# Patient Record
Sex: Female | Born: 1970 | Race: White | Hispanic: No | Smoking: Never smoker
Health system: Southern US, Community
[De-identification: ages and names within clinical notes are randomized; demographics above are authoritative.]

## PROBLEM LIST (undated history)

## (undated) DIAGNOSIS — Z8619 Personal history of other infectious and parasitic diseases: Secondary | ICD-10-CM

## (undated) DIAGNOSIS — F32A Depression, unspecified: Secondary | ICD-10-CM

## (undated) DIAGNOSIS — F329 Major depressive disorder, single episode, unspecified: Secondary | ICD-10-CM

## (undated) HISTORY — PX: BREAST ENHANCEMENT SURGERY: SHX7

## (undated) HISTORY — PX: COLPOSCOPY: SHX161

## (undated) HISTORY — DX: Personal history of other infectious and parasitic diseases: Z86.19

## (undated) HISTORY — PX: LABIOPLASTY: SHX1900

## (undated) HISTORY — DX: Depression, unspecified: F32.A

## (undated) HISTORY — PX: AUGMENTATION MAMMAPLASTY: SUR837

## (undated) HISTORY — DX: Major depressive disorder, single episode, unspecified: F32.9

---

## 2015-06-30 ENCOUNTER — Telehealth: Payer: Self-pay | Admitting: Family Medicine

## 2015-06-30 NOTE — Telephone Encounter (Signed)
errenous °

## 2015-09-06 ENCOUNTER — Encounter: Payer: Self-pay | Admitting: Family Medicine

## 2015-09-15 ENCOUNTER — Encounter: Payer: Self-pay | Admitting: Family Medicine

## 2015-09-20 ENCOUNTER — Other Ambulatory Visit: Payer: Self-pay | Admitting: Family Medicine

## 2015-09-20 MED ORDER — NORGESTIMATE-ETH ESTRADIOL 0.25-35 MG-MCG PO TABS
1.0000 | ORAL_TABLET | Freq: Every day | ORAL | Status: DC
Start: 2015-09-20 — End: 2015-09-20

## 2015-09-20 MED ORDER — NORGESTIMATE-ETH ESTRADIOL 0.25-35 MG-MCG PO TABS: 1.0000 | ORAL_TABLET | Freq: Every day | ORAL | Status: DC

## 2015-10-15 ENCOUNTER — Encounter: Payer: Self-pay | Admitting: Family Medicine

## 2015-11-13 ENCOUNTER — Emergency Department: Payer: 59

## 2015-11-13 ENCOUNTER — Encounter: Payer: Self-pay | Admitting: Emergency Medicine

## 2015-11-13 ENCOUNTER — Emergency Department
Admission: EM | Admit: 2015-11-13 | Discharge: 2015-11-13 | Disposition: A | Discharge status: Home / Self Care | Payer: 59 | Attending: Emergency Medicine | Admitting: Emergency Medicine

## 2015-11-13 DIAGNOSIS — J159 Unspecified bacterial pneumonia: Secondary | ICD-10-CM | POA: Diagnosis not present

## 2015-11-13 DIAGNOSIS — R000 Tachycardia, unspecified: Secondary | ICD-10-CM | POA: Diagnosis not present

## 2015-11-13 DIAGNOSIS — Z792 Long term (current) use of antibiotics: Secondary | ICD-10-CM | POA: Insufficient documentation

## 2015-11-13 DIAGNOSIS — Z79899 Other long term (current) drug therapy: Secondary | ICD-10-CM | POA: Diagnosis not present

## 2015-11-13 DIAGNOSIS — Z880 Allergy status to penicillin: Secondary | ICD-10-CM | POA: Diagnosis not present

## 2015-11-13 DIAGNOSIS — J189 Pneumonia, unspecified organism: Secondary | ICD-10-CM

## 2015-11-13 DIAGNOSIS — R05 Cough: Secondary | ICD-10-CM | POA: Diagnosis present

## 2015-11-13 LAB — BASIC METABOLIC PANEL
ANION GAP: 9 (ref 5–15)
BUN: 10 mg/dL (ref 6–20)
CHLORIDE: 106 mmol/L (ref 101–111)
CO2: 20 mmol/L — AB (ref 22–32)
CREATININE: 0.88 mg/dL (ref 0.44–1.00)
Calcium: 9 mg/dL (ref 8.9–10.3)
GFR calc non Af Amer: >60 mL/min (ref >60)
Glucose, Bld: 105 mg/dL — ABNORMAL HIGH (ref 65–99)
Potassium: 3.2 mmol/L — ABNORMAL LOW (ref 3.5–5.1)
Sodium: 135 mmol/L (ref 135–145)

## 2015-11-13 LAB — CBC WITH DIFFERENTIAL/PLATELET
BASOS ABS: 0 10*3/uL (ref 0–0.1)
Basophils Relative: 1 %
Eosinophils Absolute: 0 10*3/uL (ref 0–0.7)
Eosinophils Relative: 0 %
HEMATOCRIT: 37.2 % (ref 35.0–47.0)
Hemoglobin: 13.1 g/dL (ref 12.0–16.0)
LYMPHS ABS: 1.6 10*3/uL (ref 1.0–3.6)
LYMPHS PCT: 19 %
MCH: 31.3 pg (ref 26.0–34.0)
MCHC: 35.1 g/dL (ref 32.0–36.0)
MCV: 89.2 fL (ref 80.0–100.0)
Monocytes Absolute: 0.4 10*3/uL (ref 0.2–0.9)
Monocytes Relative: 5 %
NEUTROS ABS: 6.1 10*3/uL (ref 1.4–6.5)
Neutrophils Relative %: 75 %
Platelets: 195 10*3/uL (ref 150–440)
RBC: 4.17 MIL/uL (ref 3.80–5.20)
RDW: 13.5 % (ref 11.5–14.5)
WBC: 8.2 10*3/uL (ref 3.6–11.0)

## 2015-11-13 LAB — TROPONIN I: Troponin I: <0.03 ng/mL (ref <0.031)

## 2015-11-13 MED ORDER — ACETAMINOPHEN 325 MG PO TABS
650.0000 mg | ORAL_TABLET | Freq: Once | ORAL | Status: AC | PRN
  Administered 2015-11-13: 650 mg via ORAL
  Filled 2015-11-13: qty 2

## 2015-11-13 MED ORDER — AZITHROMYCIN 500 MG PO TABS: ORAL_TABLET | ORAL | Status: DC

## 2015-11-13 MED ORDER — AZITHROMYCIN 250 MG PO TABS
500.0000 mg | ORAL_TABLET | Freq: Once | ORAL | Status: AC
  Administered 2015-11-13: 500 mg via ORAL
  Filled 2015-11-13: qty 2

## 2015-11-13 NOTE — ED Provider Notes (Signed)
[facility] Emergency Department Provider Note  ____________________________________________  Time seen: 3:20 AM  I have reviewed the triage vital signs and the nursing notes.  History by:  Patient  HISTORY  Chief Complaint Cough     HPI Melinda Roach is a 44 y.o. female who began to have a cough on Thursday afternoon. This has worsened over the past 36 hours. Tonight she went to work, but did not feel well. Her coworkers commented on this. At that time, it was found she had a fever as well. She was tachycardic at 125 and tachypnea. These are the patient's descriptives - she is a Engineer, civil (consulting)nurse at [employer].  She reports some chest discomfort which she thinks is due to her persistent cough.  She confirms some shortness of breath as well as.   History reviewed. No pertinent past medical history.  There are no active problems to display for this patient.   Past Surgical History  Procedure Laterality Date  . Cesarean section      Current Outpatient Rx  Name  Route  Sig  Dispense  Refill  . azithromycin (ZITHROMAX) 500 MG tablet      Take 1 tab once a day for 2 days (The patient received the first of 3 days of treatment in the emergency department)   2 tablet   0   . norgestimate-ethinyl estradiol (ORTHO-CYCLEN,SPRINTEC,PREVIFEM) 0.25-35 MG-MCG tablet   Oral   Take 1 tablet by mouth daily.   1 Package   11     Allergies Penicillins  No family history on file.  Social History Social History  Substance Use Topics  . Smoking status: Never Smoker   . Smokeless tobacco: None  . Alcohol Use: No    Review of Systems  Constitutional: Positive for fever. ENT: Negative for congestion. Cardiovascular: Positive for chest pain. Respiratory: Positive for shortness of breath and for cough. Gastrointestinal: Negative for abdominal pain, vomiting and diarrhea. Genitourinary: Negative for dysuria. Musculoskeletal: No myalgias or injuries. Skin:  Negative for rash. Neurological: Negative for headache or focal weakness   10-point ROS otherwise negative.  ____________________________________________   PHYSICAL EXAM:  VITAL SIGNS: ED Triage Vitals  Enc Vitals Group     BP 11/13/15 0020 111/71 mmHg     Pulse Rate 11/13/15 0020 117     Resp 11/13/15 0020 22     Temp 11/13/15 0020 99.5 F (37.5 C)     Temp Source 11/13/15 0020 Oral     SpO2 11/13/15 0020 100 %     Weight 11/13/15 0020 190 lb (86.183 kg)     Height 11/13/15 0020 5\' 5"  (1.651 m)     Head Cir --      Peak Flow --      Pain Score 11/13/15 0036 4     Pain Loc --      Pain Edu? --      Excl. in GC? --     Constitutional:  Alert and oriented. Appears slightly uncomfortable but overall no acute distress.Marland Kitchen. ENT   Head: Normocephalic and atraumatic.   Nose: No congestion/rhinnorhea.       Mouth: No erythema, no swelling   Cardiovascular: Normal rate, regular rhythm, no murmur noted Respiratory:  Normal respiratory effort, no tachypnea.    Breath sounds are overall clear, with mild diminishment on the left..  Gastrointestinal: Soft and nontender. No distention.  Back: No muscle spasm, no tenderness, no CVA tenderness. Musculoskeletal: No deformity noted. Nontender with normal range of  motion in all extremities.  No noted edema. Neurologic:  Communicative. Normal appearing spontaneous movement in all 4 extremities. No gross focal neurologic deficits are appreciated.  Skin:  Skin is warm, dry. No rash noted. Psychiatric: Mood and affect are normal. Speech and behavior are normal.  ____________________________________________    LABS (pertinent positives/negatives)  Labs Reviewed  BASIC METABOLIC PANEL - Abnormal; Notable for the following:    Potassium 3.2 (*)    CO2 20 (*)    Glucose, Bld 105 (*)    All other components within normal limits  CBC WITH DIFFERENTIAL/PLATELET  TROPONIN I      ____________________________________________   EKG  ED ECG REPORT I, Mikhaila Roh W, the attending physician, personally viewed and interpreted this ECG.   Date: 11/13/2015  EKG Time: 4:43 AM  Rate: 89  Rhythm:  Normal sinus rhythm  Axis: Normal  Intervals: Normal  ST&T Change: None noted   ____________________________________________    RADIOLOGY  Chest x-ray: IMPRESSION: Focal opacity in the left lung base behind the heart probably representing focal pneumonia. Followup PA and lateral chest X-ray is recommended in 3-4 weeks following trial of antibiotic therapy to ensure resolution and exclude underlying malignancy.  ____________________________________________   INITIAL IMPRESSION / ASSESSMENT AND PLAN / ED COURSE  Pertinent labs & imaging results that were available during my care of the patient were reviewed by me and considered in my medical decision making (see chart for details).  Alert, communicative, 44 year old nurse with a bad cough. Chest x-ray shows a small focal area that is worrisome for pneumonia. We will start the patient on antibiotics.  I discussed further testing with the patient. She prefers to have blood work drawn at this time. We will treat her with azithromycin. At this time, her heart rate is 86, her oxygen saturation level was 97%.. She appears nontoxic and overall well.  ----------------------------------------- 5:12 AM on 11/13/2015 -----------------------------------------  Blood tests are overall unremarkable. She is white blood cell count of 8.2. Troponin is undetectable at less than 0.03. An EKG that was obtained shows no ischemic changes.  We will discharge the patient home with a projection for tumor days of azithromycin, 500 mg per day. I've explained to the patient had this will give her 10 days of antibiotic coverage. I've asked her to follow with her primary physician and to return to the emergency department if she feels  worse or has other urgent concerns.  ____________________________________________   FINAL CLINICAL IMPRESSION(S) / ED DIAGNOSES  Final diagnoses:  Community acquired pneumonia      Darien Ramus, MD 11/13/15 [phone]

## 2015-11-13 NOTE — ED Notes (Signed)
Pt reports dry cough since yesterday.  Pt reports some chest heaviness.  Pt reports her BP was up and pulse fast.  Pt NAD at this time.

## 2015-11-13 NOTE — ED Notes (Signed)
Pt. States she woke this morning with sore throat and cold like symptoms.  Pt. Denies anyone in house with like symptoms.

## 2015-11-13 NOTE — Discharge Instructions (Signed)

## 2015-11-15 ENCOUNTER — Encounter: Payer: Self-pay | Admitting: Family Medicine

## 2015-11-16 ENCOUNTER — Other Ambulatory Visit: Payer: Self-pay | Admitting: Family Medicine

## 2015-11-16 ENCOUNTER — Encounter: Payer: Self-pay | Admitting: Family Medicine

## 2015-11-16 ENCOUNTER — Ambulatory Visit (INDEPENDENT_AMBULATORY_CARE_PROVIDER_SITE_OTHER): Payer: 59 | Admitting: Family Medicine

## 2015-11-16 VITALS — BP 108/68 | HR 98 | Temp 98.9°F | Resp 18 | Wt 193.4 lb

## 2015-11-16 DIAGNOSIS — Z1231 Encounter for screening mammogram for malignant neoplasm of breast: Secondary | ICD-10-CM | POA: Insufficient documentation

## 2015-11-16 DIAGNOSIS — Z3041 Encounter for surveillance of contraceptive pills: Secondary | ICD-10-CM | POA: Diagnosis not present

## 2015-11-16 DIAGNOSIS — Z113 Encounter for screening for infections with a predominantly sexual mode of transmission: Secondary | ICD-10-CM | POA: Diagnosis not present

## 2015-11-16 DIAGNOSIS — L918 Other hypertrophic disorders of the skin: Secondary | ICD-10-CM

## 2015-11-16 DIAGNOSIS — G4700 Insomnia, unspecified: Secondary | ICD-10-CM | POA: Insufficient documentation

## 2015-11-16 DIAGNOSIS — R059 Cough, unspecified: Secondary | ICD-10-CM | POA: Insufficient documentation

## 2015-11-16 DIAGNOSIS — F39 Unspecified mood [affective] disorder: Secondary | ICD-10-CM

## 2015-11-16 DIAGNOSIS — Z0000 Encounter for general adult medical examination without abnormal findings: Secondary | ICD-10-CM | POA: Diagnosis not present

## 2015-11-16 DIAGNOSIS — R05 Cough: Secondary | ICD-10-CM | POA: Insufficient documentation

## 2015-11-16 DIAGNOSIS — Z124 Encounter for screening for malignant neoplasm of cervix: Secondary | ICD-10-CM | POA: Diagnosis not present

## 2015-11-16 DIAGNOSIS — R3129 Other microscopic hematuria: Secondary | ICD-10-CM | POA: Insufficient documentation

## 2015-11-16 LAB — POCT URINE PREGNANCY: Preg Test, Ur: NEGATIVE

## 2015-11-16 MED ORDER — HYDROCOD POLST-CPM POLST ER 10-8 MG/5ML PO SUER: 5.0000 mL | Freq: Two times a day (BID) | ORAL | Status: DC | PRN

## 2015-11-16 MED ORDER — DULOXETINE HCL 20 MG PO CPEP: 20.0000 mg | ORAL_CAPSULE | Freq: Every day | ORAL | Status: DC

## 2015-11-16 MED ORDER — ZOLPIDEM TARTRATE 5 MG PO TABS: 5.0000 mg | ORAL_TABLET | Freq: Every evening | ORAL | Status: DC | PRN

## 2015-11-16 NOTE — Progress Notes (Signed)
Name: Melinda Roach   MRN: [id number]    DOB: Apr 10, 1971   Date:11/16/2015       Progress Note  Subjective  Chief Complaint  Chief Complaint  Patient presents with  . Annual Exam    PAP, urine given    HPI  Patient is here today for a Complete Female Physical Exam:  The patient was recently seen in the ER for cold/flu symptoms and is currently on Azithromycin. Overall feels that health needs are stable but would like to try something else for her decreased interest in things. Prozac generic up to 40 mg was not helpful. She would like a referral to Dermatology for skin lesion removal. 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 Has tried OTC melatonin with no relief. Has tried Ambien in the past with no side effects.   Diet is well balanced. In general does not exercise regularly. Sees dentist regularly and addresses vision concerns with ophthalmologist if applicable. In regards to sexual activity the patient is currently sexually active. Currently is concerned about exposure to any STDs. Previous partner was sleeping with another female partner who's husband was having sex with other men.   Menstrual history is regular. Has to switch to Ortho Tri Cyclen as this is what her insurance would cover.  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    Active Ambulatory Problems    Diagnosis Date Noted  . Postoperative complication of cesarean section 11/16/2015  . Hematuria, microscopic 11/16/2015  . History of depression 11/16/2015  . Annual physical exam 11/16/2015  . Encounter for screening mammogram for malignant neoplasm of breast 11/16/2015  . Encounter for surveillance of contraceptive pills 11/16/2015   Resolved Ambulatory Problems    Diagnosis Date Noted  . No Resolved Ambulatory Problems   Past Medical History  Diagnosis Date  . Depression     Past Surgical History  Procedure Laterality Date  . Cesarean section       History reviewed. No pertinent family history.  Social History   Social History  . Marital Status: Divorced    Spouse Name: N/A  . Number of Children: N/A  . Years of Education: N/A   Occupational History  . Not on file.   Social History Main Topics  . Smoking status: Never Smoker   . Smokeless tobacco: Not on file  . Alcohol Use: No  . Drug Use: No  . Sexual Activity: Not on file   Other Topics Concern  . Not on file   Social History Narrative     Current outpatient prescriptions:  .  norgestimate-ethinyl estradiol (ORTHO-CYCLEN,SPRINTEC,PREVIFEM) 0.25-35 MG-MCG tablet, Take 1 tablet by mouth daily., Disp: 1 Package, Rfl: 11  Allergies  Allergen Reactions  . Penicillins Rash    ROS  CONSTITUTIONAL: No significant weight changes, fever, chills, weakness or fatigue.  HEENT:  - Eyes: No visual changes.  - Ears: No auditory changes. No pain.  - Nose: No sneezing, congestion, runny nose. - Throat: No sore throat. No changes in swallowing. SKIN: No rash or itching.  CARDIOVASCULAR: No chest pain, chest pressure or chest discomfort. No palpitations or edema.  RESPIRATORY: No shortness of breath, cough or sputum.  GASTROINTESTINAL: No anorexia, nausea, vomiting. No changes in bowel habits. No abdominal pain or blood.  GENITOURINARY: No dysuria. No frequency. No discharge.  NEUROLOGICAL: No headache, dizziness, syncope, paralysis, ataxia, numbness or tingling in the extremities. No memory changes. No change in  bowel or bladder control.  MUSCULOSKELETAL: No joint pain. No muscle pain. HEMATOLOGIC: No anemia, bleeding or bruising.  LYMPHATICS: No enlarged lymph nodes.  PSYCHIATRIC: Yes change in mood. No change in sleep pattern.  ENDOCRINOLOGIC: No reports of sweating, cold or heat intolerance. No polyuria or polydipsia.   Objective  Filed Vitals:   11/16/15 1022  BP: 108/68  Pulse: 98  Temp: 98.9 F (37.2 C)  TempSrc: Oral  Resp: 18  Weight: 193 lb 6.4 oz  (87.726 kg)  SpO2: 97%   Body mass index is 32.18 kg/(m^2).  Depression screen [facility] 2/9 11/16/2015  Decreased Interest 0  Down, Depressed, Hopeless 0  PHQ - 2 Score 0      Recent Results (from the past 2160 hour(s))  Basic metabolic panel     Status: Abnormal   Collection Time: 11/13/15  4:07 AM  Result Value Ref Range   Sodium 135 135 - 145 mmol/L   Potassium 3.2 (L) 3.5 - 5.1 mmol/L   Chloride 106 101 - 111 mmol/L   CO2 20 (L) 22 - 32 mmol/L   Glucose, Bld 105 (H) 65 - 99 mg/dL   BUN 10 6 - 20 mg/dL   Creatinine, Ser 0.88 0.44 - 1.00 mg/dL   Calcium 9.0 8.9 - 10.3 mg/dL   GFR calc non Af Amer >60 >60 mL/min   GFR calc Af Amer >60 >60 mL/min    Comment: (NOTE) The eGFR has been calculated using the CKD EPI equation. This calculation has not been validated in all clinical situations. eGFR's persistently <60 mL/min signify possible Chronic Kidney Disease.    Anion gap 9 5 - 15  CBC with Differential     Status: None   Collection Time: 11/13/15  4:07 AM  Result Value Ref Range   WBC 8.2 3.6 - 11.0 K/uL   RBC 4.17 3.80 - 5.20 MIL/uL   Hemoglobin 13.1 12.0 - 16.0 g/dL   HCT 37.2 35.0 - 47.0 %   MCV 89.2 80.0 - 100.0 fL   MCH 31.3 26.0 - 34.0 pg   MCHC 35.1 32.0 - 36.0 g/dL   RDW 13.5 11.5 - 14.5 %   Platelets 195 150 - 440 K/uL   Neutrophils Relative % 75 %   Neutro Abs 6.1 1.4 - 6.5 K/uL   Lymphocytes Relative 19 %   Lymphs Abs 1.6 1.0 - 3.6 K/uL   Monocytes Relative 5 %   Monocytes Absolute 0.4 0.2 - 0.9 K/uL   Eosinophils Relative 0 %   Eosinophils Absolute 0.0 0 - 0.7 K/uL   Basophils Relative 1 %   Basophils Absolute 0.0 0 - 0.1 K/uL  Troponin I     Status: None   Collection Time: 11/13/15  4:07 AM  Result Value Ref Range   Troponin I <0.03 <0.031 ng/mL    Comment:        NO INDICATION OF MYOCARDIAL INJURY.   POCT urine pregnancy     Status: Normal   Collection Time: 11/16/15 10:37 AM  Result Value Ref Range   Preg Test, Ur Negative Negative     Physical Exam  Constitutional: Patient is overweight and well-nourished. In no distress.  HEENT:  - Head: Normocephalic and atraumatic.  - Ears: Bilateral TMs gray, no erythema or effusion - Nose: Nasal mucosa moist - Mouth/Throat: Oropharynx is clear and moist. No tonsillar hypertrophy or erythema. No post nasal drainage.  - Eyes: Conjunctivae clear, EOM movements normal. PERRLA. No scleral icterus.  Neck: Normal  range of motion. Neck supple. No JVD present. No thyromegaly present.  Cardiovascular: Normal rate, regular rhythm and normal heart sounds.  No murmur heard.  Pulmonary/Chest: Effort normal and breath sounds normal with exception to left mid-lower lung field has some muffled sounds. No respiratory distress. Abdominal: Soft. Bowel sounds are normal, no distension. There is no tenderness. no masses BREAST: Bilateral breast exam normal with no masses, skin changes or nipple discharge. Silicone breast implants in place. FEMALE GENITALIA:  External genitalia normal External urethra normal Vaginal vault normal without discharge or lesions Cervix normal without discharge or lesions Bimanual exam normal without masses RECTAL: no rectal masses or hemorrhoids Musculoskeletal: Normal range of motion bilateral UE and LE, no joint effusions. Peripheral vascular: Bilateral LE no edema. Neurological: CN II-XII grossly intact with no focal deficits. Alert and oriented to person, place, and time. Coordination, balance, strength, speech and gait are normal.  Skin: Skin is warm and dry. No rash noted. No erythema. Scattered pedunculated skin tags just below sternum, right axillar, right mid back, around base of neck. Psychiatric: Patient has a normal mood and affect. Behavior is normal in office today. Judgment and thought content normal in office today.   Assessment & Plan  1. Annual physical exam Discussed in detail all recommended preventative measures appropriate for age and gender  now and in the future.   2. Encounter for screening mammogram for malignant neoplasm of breast  - MM Digital Screening; Future  3. Encounter for surveillance of contraceptive pills  - POCT urine pregnancy  4. Screening for STD (sexually transmitted disease) Highly recommended testing for HIV, Syphilis but she wants to await this.   - Chlamydia/Gonococcus/Trichomonas, NAA  5. Encounter for screening for malignant neoplasm of cervix  - Pap IG and HPV (high risk) DNA detection  6. 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 Will try Cymbalta. The patient has been counseled on the proper use, side effects and potential interactions of the new medication. Patient encouraged to review the side effects and safety profile pamphlet provided with the prescription from the pharmacy as well as request counseling from the pharmacy team as needed.   - DULoxetine (CYMBALTA) 20 MG capsule; Take 1 capsule (20 mg total) by mouth daily.  Dispense: 30 capsule; Refill: 3  7. Insomnia Restart Ambien prn. The patient has been counseled on the proper use, side effects and potential interactions of the new medication. Patient encouraged to review the side effects and safety profile pamphlet provided with the prescription from the pharmacy as well as request counseling from the pharmacy team as needed.   - zolpidem (AMBIEN) 5 MG tablet; Take 1 tablet (5 mg total) by mouth at bedtime as needed for sleep.  Dispense: 15 tablet; Refill: 3  8. Cough Finished abx for PNA. Residual cough. If no improvement in 2 weeks call me for CXR order.  - chlorpheniramine-HYDROcodone (TUSSIONEX PENNKINETIC ER) 10-8 MG/5ML SUER; Take 5 mLs by mouth every 12 (twelve) hours as needed for cough.  Dispense: 115 mL; Refill: 0  9. Skin tag  - Ambulatory referral to Dermatology

## 2015-11-18 LAB — CHLAMYDIA/GONOCOCCUS/TRICHOMONAS, NAA: Trich vag by NAA: NEGATIVE

## 2015-11-22 ENCOUNTER — Other Ambulatory Visit: Payer: Self-pay | Admitting: Family Medicine

## 2015-11-22 ENCOUNTER — Telehealth: Payer: Self-pay | Admitting: Family Medicine

## 2015-11-22 DIAGNOSIS — F39 Unspecified mood [affective] disorder: Secondary | ICD-10-CM

## 2015-11-22 MED ORDER — SERTRALINE HCL 50 MG PO TABS: 50.0000 mg | ORAL_TABLET | Freq: Every day | ORAL | Status: DC

## 2015-11-22 NOTE — Telephone Encounter (Signed)
Medication was switched to Zoloft 50mg  to try for a month.

## 2015-11-22 NOTE — Telephone Encounter (Signed)
Called pt to inform her that she would need to schedule an appt to discuss switching her Cymbalta to something else. Pt would like a call back.

## 2015-11-22 NOTE — Telephone Encounter (Signed)
New Rx sent to pharmacy

## 2015-11-24 ENCOUNTER — Telehealth: Payer: Self-pay

## 2015-11-24 ENCOUNTER — Other Ambulatory Visit: Payer: Self-pay | Admitting: Family Medicine

## 2015-11-24 DIAGNOSIS — R87612 Low grade squamous intraepithelial lesion on cytologic smear of cervix (LGSIL): Secondary | ICD-10-CM | POA: Insufficient documentation

## 2015-11-24 LAB — PAP IG AND HPV HIGH-RISK
HPV, HIGH-RISK: POSITIVE — AB
PAP Smear Comment: 0

## 2015-11-24 NOTE — Telephone Encounter (Signed)
I contacted this patient to review abnormal labs and to see if she would like to be referred to [facility]. Patient stated that she used to go to [facility] and prefers to go there but she will have to see if her insurance will cover it. I told her to find out which one will be covered then give us a call back so we can place the referral. She said ok.

## 2015-11-24 NOTE — Telephone Encounter (Signed)
Patient stated that [facility] will cover visit and she is requesting to see Dr. Thalia PartyPhillip Rosenow.  Referral was placed.  A copy of labs were placed in the mail.

## 2015-12-24 ENCOUNTER — Encounter: Payer: Self-pay | Admitting: Family Medicine

## 2016-02-12 ENCOUNTER — Other Ambulatory Visit: Payer: Self-pay | Admitting: Family Medicine

## 2016-10-17 ENCOUNTER — Telehealth: Payer: Self-pay | Admitting: Family Medicine

## 2016-10-17 NOTE — Telephone Encounter (Signed)
Patient notified

## 2016-10-17 NOTE — Telephone Encounter (Signed)
Chart reviewed Patient was referred to GYN Please ask her to contact GYN for birth control pills since they are now monitoring her pap smears

## 2016-11-17 ENCOUNTER — Encounter: Payer: Self-pay | Admitting: Family Medicine

## 2016-11-17 ENCOUNTER — Ambulatory Visit (INDEPENDENT_AMBULATORY_CARE_PROVIDER_SITE_OTHER): Payer: BLUE CROSS/BLUE SHIELD | Admitting: Family Medicine

## 2016-11-17 VITALS — BP 116/80 | HR 94 | Temp 98.2°F | Resp 14 | Ht 64.5 in | Wt 206.0 lb

## 2016-11-17 DIAGNOSIS — R87612 Low grade squamous intraepithelial lesion on cytologic smear of cervix (LGSIL): Secondary | ICD-10-CM

## 2016-11-17 DIAGNOSIS — Z0000 Encounter for general adult medical examination without abnormal findings: Secondary | ICD-10-CM

## 2016-11-17 DIAGNOSIS — F39 Unspecified mood [affective] disorder: Secondary | ICD-10-CM | POA: Diagnosis not present

## 2016-11-17 DIAGNOSIS — G4700 Insomnia, unspecified: Secondary | ICD-10-CM | POA: Diagnosis not present

## 2016-11-17 LAB — CBC WITH DIFFERENTIAL/PLATELET
BASOS PCT: 0 %
Basophils Absolute: 0 cells/uL (ref 0–200)
EOS ABS: 98 {cells}/uL (ref 15–500)
Eosinophils Relative: 1 %
HCT: 42.8 % (ref 35.0–45.0)
Hemoglobin: 14.1 g/dL (ref 11.7–15.5)
LYMPHS PCT: 27 %
Lymphs Abs: 2646 cells/uL (ref 850–3900)
MCH: 31.1 pg (ref 27.0–33.0)
MCHC: 32.9 g/dL (ref 32.0–36.0)
MCV: 94.3 fL (ref 80.0–100.0)
MONOS PCT: 5 %
MPV: 9.8 fL (ref 7.5–12.5)
Monocytes Absolute: 490 cells/uL (ref 200–950)
Neutro Abs: 6566 cells/uL (ref 1500–7800)
Neutrophils Relative %: 67 %
PLATELETS: 282 10*3/uL (ref 140–400)
RBC: 4.54 MIL/uL (ref 3.80–5.10)
RDW: 13.7 % (ref 11.0–15.0)
WBC: 9.8 10*3/uL (ref 3.8–10.8)

## 2016-11-17 NOTE — Assessment & Plan Note (Signed)
Let her know about risks of sedative hypnotics; I do not recommend them; also suggested she check out [employer] podcast, info given in check-out AVS

## 2016-11-17 NOTE — Patient Instructions (Addendum)
12 Ways to Curb Anxiety  ?Anxiety is normal human sensation. It is what helped our ancestors survive the pitfalls of the wilderness. Anxiety is defined as experiencing worry or nervousness about an imminent event or something with an uncertain outcome. It is a feeling experienced by most people at some point in their lives. Anxiety can be triggered by a very personal issue, such as the illness of a loved one, or an event of global proportions, such as a refugee crisis. Some of the symptoms of anxiety are:  Feeling restless.  Having a feeling of impending danger.  Increased heart rate.  Rapid breathing. Sweating.  Shaking.  Weakness or feeling tired.  Difficulty concentrating on anything except the current worry.  Insomnia.  Stomach or bowel problems. What can we do about anxiety we may be feeling? There are many techniques to help manage stress and relax. Here are 12 ways you can reduce your anxiety almost immediately: 1. Turn off the constant feed of information. Take a social media sabbatical. Studies have shown that social media directly contributes to social anxiety.  2. Monitor your television viewing habits. Are you watching shows that are also contributing to your anxiety, such as 24-hour news stations? Try watching something else, or better yet, nothing at all. Instead, listen to music, read an inspirational book or practice a hobby. 3. Eat nutritious meals. Also, don't skip meals and keep healthful snacks on hand. Hunger and poor diet contributes to feeling anxious. 4. Sleep. Sleeping on a regular schedule for at least seven to eight hours a night will do wonders for your outlook when you are awake. 5. Exercise. Regular exercise will help rid your body of that anxious energy and help you get more restful sleep. 6. Try deep (diaphragmatic) breathing. Inhale slowly through your nose for five seconds and exhale through your mouth. 7. Practice acceptance and gratitude. When anxiety hits,  accept that there are things out of your control that shouldn't be of immediate concern.  8. Seek out humor. When anxiety strikes, watch a funny video, read jokes or call a friend who makes you laugh. Laughter is healing for our bodies and releases endorphins that are calming. 9. Stay positive. Take the effort to replace negative thoughts with positive ones. Try to see a stressful situation in a positive light. Try to come up with solutions rather than dwelling on the problem. 10. Figure out what triggers your anxiety. Keep a journal and make note of anxious moments and the events surrounding them. This will help you identify triggers you can avoid or even eliminate. 11. Talk to someone. Let a trusted friend, family member or even trained professional know that you are feeling overwhelmed and anxious. Verbalize what you are feeling and why.  12. Volunteer. If your anxiety is triggered by a crisis on a large scale, become an advocate and work to resolve the problem that is causing you unease. Anxiety is often unwelcome and can become overwhelming. If not kept in check, it can become a disorder that could require medical treatment. However, if you take the time to care for yourself and avoid the triggers that make you anxious, you will be able to find moments of relaxation and clarity that make your life much more enjoyable.    Steps to Elicit the Relaxation Response The following is the technique reprinted with permission from Dr. Billie Ruddy book The Relaxation Response pages 162-163 1. Sit quietly in a comfortable position. 2. Close your eyes. 3.  Deeply relax all your muscles,  beginning at your feet and progressing up to your face.  Keep them relaxed. 4. Breathe through your nose.  Become aware of your breathing.  As you breathe out, say the word, "one"*,  silently to yourself. For example,  breathe in ... out, "one",- in .. out, "one", etc.  Breathe easily and naturally. 5. Continue  for 10 to 20 minutes.  You may open your eyes to check the time, but do not use an alarm.  When you finish, sit quietly for several minutes,  at first with your eyes closed and later with your eyes opened.  Do not stand up for a few minutes. 6. Do not worry about whether you are successful  in achieving a deep level of relaxation.  Maintain a passive attitude and permit relaxation to occur at its own pace.  When distracting thoughts occur,  try to ignore them by not dwelling upon them  and return to repeating "one."  With practice, the response should come with little effort.  Practice the technique once or twice daily,  but not within two hours after any meal,  since the digestive processes seem to interfere with  the elicitation of the Relaxation Response. * It is better to use a soothing, mellifluous sound, preferably with no meaning. or association, to avoid stimulation of unnecessary thoughts - a mantra.  [email or web address] Maintenance, Female Introduction Adopting a healthy lifestyle and getting preventive care can go a long way to promote health and wellness. Talk with your health care provider about what schedule of regular examinations is right for you. This is a good chance for you to check in with your provider about disease prevention and staying healthy. In between checkups, there are plenty of things you can do on your own. Experts have done a lot of research about which lifestyle changes and preventive measures are most likely to keep you healthy. Ask your health care provider for more information. Weight and diet Eat a healthy diet  Be sure to include plenty of vegetables, fruits, low-fat dairy products, and lean protein.  Do not eat a lot of foods high in solid fats, added sugars, or salt.  Get regular exercise. This is one of the most important  things you can do for your health.  Most adults should exercise for at least 150 minutes each week. The exercise should increase your heart rate and make you sweat (moderate-intensity exercise).  Most adults should also do strengthening exercises at least twice a week. This is in addition to the moderate-intensity exercise. Maintain a healthy weight  Body mass index (BMI) is a measurement that can be used to identify possible weight problems. It estimates body fat based on height and weight. Your health care provider can help determine your BMI and help you achieve or maintain a healthy weight.  For females 41 years of age and older:  A BMI below 18.5 is considered underweight.  A BMI of 18.5 to 24.9 is normal.  A BMI of 25 to 29.9 is considered overweight.  A BMI of 30 and above is considered obese. Watch levels of cholesterol and blood lipids  You should start having your blood tested for lipids and cholesterol at 44 years of age, then have this test every 5 years.  You may need to have your cholesterol levels checked more often if:  Your lipid or cholesterol levels are high.  You are older than 44 years of age.  You are at  high risk for heart disease. Cancer screening Lung Cancer  Lung cancer screening is recommended for adults 82-53 years old who are at high risk for lung cancer because of a history of smoking.  A yearly low-dose CT scan of the lungs is recommended for people who:  Currently smoke.  Have quit within the past 15 years.  Have at least a 30-pack-year history of smoking. A pack year is smoking an average of one pack of cigarettes a day for 1 year.  Yearly screening should continue until it has been 15 years since you quit.  Yearly screening should stop if you develop a health problem that would prevent you from having lung cancer treatment. Breast Cancer  Practice breast self-awareness. This means understanding how your breasts normally appear and  feel.  It also means doing regular breast self-exams. Let your health care provider know about any changes, no matter how small.  If you are in your 20s or 30s, you should have a clinical breast exam (CBE) by a health care provider every 1-3 years as part of a regular health exam.  If you are 15 or older, have a CBE every year. Also consider having a breast X-ray (mammogram) every year.  If you have a family history of breast cancer, talk to your health care provider about genetic screening.  If you are at high risk for breast cancer, talk to your health care provider about having an MRI and a mammogram every year.  Breast cancer gene (BRCA) assessment is recommended for women who have family members with BRCA-related cancers. BRCA-related cancers include:  Breast.  Ovarian.  Tubal.  Peritoneal cancers.  Results of the assessment will determine the need for genetic counseling and BRCA1 and BRCA2 testing. Cervical Cancer  Your health care provider may recommend that you be screened regularly for cancer of the pelvic organs (ovaries, uterus, and vagina). This screening involves a pelvic examination, including checking for microscopic changes to the surface of your cervix (Pap test). You may be encouraged to have this screening done every 3 years, beginning at age 68.  For women ages 40-65, health care providers may recommend pelvic exams and Pap testing every 3 years, or they may recommend the Pap and pelvic exam, combined with testing for human papilloma virus (HPV), every 5 years. Some types of HPV increase your risk of cervical cancer. Testing for HPV may also be done on women of any age with unclear Pap test results.  Other health care providers may not recommend any screening for nonpregnant women who are considered low risk for pelvic cancer and who do not have symptoms. Ask your health care provider if a screening pelvic exam is right for you.  If you have had past treatment for  cervical cancer or a condition that could lead to cancer, you need Pap tests and screening for cancer for at least 20 years after your treatment. If Pap tests have been discontinued, your risk factors (such as having a new sexual partner) need to be reassessed to determine if screening should resume. Some women have medical problems that increase the chance of getting cervical cancer. In these cases, your health care provider may recommend more frequent screening and Pap tests. Colorectal Cancer  This type of cancer can be detected and often prevented.  Routine colorectal cancer screening usually begins at 44 years of age and continues through 44 years of age.  Your health care provider may recommend screening at an earlier age if you  have risk factors for colon cancer.  Your health care provider may also recommend using home test kits to check for hidden blood in the stool.  A small camera at the end of a tube can be used to examine your colon directly (sigmoidoscopy or colonoscopy). This is done to check for the earliest forms of colorectal cancer.  Routine screening usually begins at age 43.  Direct examination of the colon should be repeated every 5-10 years through 44 years of age. However, you may need to be screened more often if early forms of precancerous polyps or small growths are found. Skin Cancer  Check your skin from head to toe regularly.  Tell your health care provider about any new moles or changes in moles, especially if there is a change in a mole's shape or color.  Also tell your health care provider if you have a mole that is larger than the size of a pencil eraser.  Always use sunscreen. Apply sunscreen liberally and repeatedly throughout the day.  Protect yourself by wearing long sleeves, pants, a wide-brimmed hat, and sunglasses whenever you are outside. Heart disease, diabetes, and high blood pressure  High blood pressure causes heart disease and increases the  risk of stroke. High blood pressure is more likely to develop in:  People who have blood pressure in the high end of the normal range (130-139/85-89 mm Hg).  People who are overweight or obese.  People who are African American.  If you are 19-59 years of age, have your blood pressure checked every 3-5 years. If you are 17 years of age or older, have your blood pressure checked every year. You should have your blood pressure measured twice-once when you are at a hospital or clinic, and once when you are not at a hospital or clinic. Record the average of the two measurements. To check your blood pressure when you are not at a hospital or clinic, you can use:  An automated blood pressure machine at a pharmacy.  A home blood pressure monitor.  If you are between 49 years and 91 years old, ask your health care provider if you should take aspirin to prevent strokes.  Have regular diabetes screenings. This involves taking a blood sample to check your fasting blood sugar level.  If you are at a normal weight and have a low risk for diabetes, have this test once every three years after 44 years of age.  If you are overweight and have a high risk for diabetes, consider being tested at a younger age or more often. Preventing infection Hepatitis B  If you have a higher risk for hepatitis B, you should be screened for this virus. You are considered at high risk for hepatitis B if:  You were born in a country where hepatitis B is common. Ask your health care provider which countries are considered high risk.  Your parents were born in a high-risk country, and you have not been immunized against hepatitis B (hepatitis B vaccine).  You have HIV or AIDS.  You use needles to inject street drugs.  You live with someone who has hepatitis B.  You have had sex with someone who has hepatitis B.  You get hemodialysis treatment.  You take certain medicines for conditions, including cancer, organ  transplantation, and autoimmune conditions. Hepatitis C  Blood testing is recommended for:  Everyone born from [birth year] through 1964.  Anyone with known risk factors for hepatitis C. Sexually transmitted infections (STIs)  You  should be screened for sexually transmitted infections (STIs) including gonorrhea and chlamydia if:  You are sexually active and are younger than 44 years of age.  You are older than 44 years of age and your health care provider tells you that you are at risk for this type of infection.  Your sexual activity has changed since you were last screened and you are at an increased risk for chlamydia or gonorrhea. Ask your health care provider if you are at risk.  If you do not have HIV, but are at risk, it may be recommended that you take a prescription medicine daily to prevent HIV infection. This is called pre-exposure prophylaxis (PrEP). You are considered at risk if:  You are sexually active and do not regularly use condoms or know the HIV status of your partner(s).  You take drugs by injection.  You are sexually active with a partner who has HIV. Talk with your health care provider about whether you are at high risk of being infected with HIV. If you choose to begin PrEP, you should first be tested for HIV. You should then be tested every 3 months for as long as you are taking PrEP. Pregnancy  If you are premenopausal and you may become pregnant, ask your health care provider about preconception counseling.  If you may become pregnant, take 400 to 800 micrograms (mcg) of folic acid every day.  If you want to prevent pregnancy, talk to your health care provider about birth control (contraception). Osteoporosis and menopause  Osteoporosis is a disease in which the bones lose minerals and strength with aging. This can result in serious bone fractures. Your risk for osteoporosis can be identified using a bone density scan.  If you are 90 years of age or older, or  if you are at risk for osteoporosis and fractures, ask your health care provider if you should be screened.  Ask your health care provider whether you should take a calcium or vitamin D supplement to lower your risk for osteoporosis.  Menopause may have certain physical symptoms and risks.  Hormone replacement therapy may reduce some of these symptoms and risks. Talk to your health care provider about whether hormone replacement therapy is right for you. Follow these instructions at home:  Schedule regular health, dental, and eye exams.  Stay current with your immunizations.  Do not use any tobacco products including cigarettes, chewing tobacco, or electronic cigarettes.  If you are pregnant, do not drink alcohol.  If you are breastfeeding, limit how much and how often you drink alcohol.  Limit alcohol intake to no more than 1 drink per day for nonpregnant women. One drink equals 12 ounces of beer, 5 ounces of wine, or 1 ounces of hard liquor.  Do not use street drugs.  Do not share needles.  Ask your health care provider for help if you need support or information about quitting drugs.  Tell your health care provider if you often feel depressed.  Tell your health care provider if you have ever been abused or do not feel safe at home. This information is not intended to replace advice given to you by your health care provider. Make sure you discuss any questions you have with your health care provider. Document Released: 06/19/2011 Document Revised: 05/11/2016 Document Reviewed: 09/07/2015  2017 [employer]vier

## 2016-11-17 NOTE — Assessment & Plan Note (Signed)
USPSTF grade A and B recommendations reviewed with patient; age-appropriate recommendations, preventive care, screening tests, etc discussed and encouraged; healthy living encouraged; see AVS for patient education given to patient  

## 2016-11-17 NOTE — Progress Notes (Signed)
Patient ID: Melinda Roach, female   DOB: 09-12-1971, 45 y.o.   MRN: [id number]   Subjective:   Melinda Roach is a 45 y.o. female here for a complete physical exam  Interim issues since last visit: no medical excitement; she has had some stress with family issues, daughter attempted suicide with patient's sleeping pills; seeing counselor now with her daughter  USPSTF grade A and B recommendations Alcohol:  Depression:  Depression screen [facility] 2/9 11/17/2016 11/16/2015  Decreased Interest 0 0  Down, Depressed, Hopeless 1 0  PHQ - 2 Score 1 0   Works third shift, not sleeping much Hypertension: excellent Obesity: some weight gain is muscle Tobacco use: never HIV, hep B, hep C: declined STD testing and prevention (chl/gon/syphilis): declined Lipids: nonfasting, but wishes to check Glucose: check, nonfasting, chocolate pie around 9 am Colorectal cancer: no, start 50 Breast cancer: gyn BRCA gene screening: no fam hx Intimate partner violence: no Cervical cancer screening: gyn, had abnormal pap smear Lung cancer: n/a Osteoporosis: n/a Fall prevention/vitamin D: suggested 1000 iu vit D3 daily or outdoors AAA: n/a Aspirin: n/a Diet: good eater Exercise: going to gym Skin cancer: nothing worrisome  She works third shift and has trouble sleeping some times; had ambien, but that's what her daughter took in a suicide attempt so no more in the house  Past Medical History:  Diagnosis Date  . Depression    Past Surgical History:  Procedure Laterality Date  . CESAREAN SECTION     Family History  Problem Relation Age of Onset  . Diabetes Mother   . Hyperlipidemia Mother   . COPD Father   . Depression Daughter    Social History  Substance Use Topics  . Smoking status: Never Smoker  . Smokeless tobacco: Never Used  . Alcohol use No   Review of Systems  Objective:   Vitals:   11/17/16 1104  BP: 116/80  Pulse: 94  Resp: 14  Temp: 98.2 F (36.8 C)  TempSrc: Oral  SpO2:  98%  Weight: 206 lb (93.4 kg)  Height: 5' 4.5" (1.638 m)   Body mass index is 34.81 kg/m. Wt Readings from Last 3 Encounters:  11/17/16 206 lb (93.4 kg)  11/16/15 193 lb 6.4 oz (87.7 kg)  11/13/15 190 lb (86.2 kg)   Physical Exam  Constitutional: She appears well-developed and well-nourished.  Obese; weight gain 12+ pounds over last year  HENT:  Head: Normocephalic and atraumatic.  Right Ear: Hearing, tympanic membrane, external ear and ear canal normal.  Left Ear: Hearing, tympanic membrane, external ear and ear canal normal.  Eyes: Conjunctivae and EOM are normal. Right eye exhibits no hordeolum. Left eye exhibits no hordeolum. No scleral icterus.  Neck: Carotid bruit is not present. No thyromegaly present.  Cardiovascular: Normal rate, regular rhythm, S1 normal, S2 normal and normal heart sounds.   No extrasystoles are present.  Pulmonary/Chest: Effort normal and breath sounds normal. No respiratory distress.  Abdominal: Soft. Normal appearance and bowel sounds are normal. She exhibits no distension, no abdominal bruit, no pulsatile midline mass and no mass. There is no hepatosplenomegaly. There is no tenderness. No hernia.  Musculoskeletal: Normal range of motion. She exhibits no edema.  Lymphadenopathy:       Head (right side): No submandibular adenopathy present.       Head (left side): No submandibular adenopathy present.    She has no cervical adenopathy.    She has no axillary adenopathy.  Neurological: She is alert. She displays no  tremor. No cranial nerve deficit. She exhibits normal muscle tone. Gait normal.  Reflex Scores:      Patellar reflexes are 2+ on the right side and 2+ on the left side. Skin: Skin is warm and dry. No bruising and no ecchymosis noted. No cyanosis. No pallor.  Psychiatric: She has a normal mood and affect. Her speech is normal and behavior is normal. Thought content normal. Her mood appears not anxious. She does not exhibit a depressed mood.     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[place])    Suggested anxiety coping strategies, relaxation response; she is seeing a counselor with daughter      Low grade squamous intraepithelial lesion (LGSIL) on Papanicolaou smear of cervix    Managed by GYN      Insomnia    Let her know about risks of sedative hypnotics; I do not recommend them; also suggested she check out [employer], info given in check-out AVS      Annual physical exam    USPSTF grade A and B recommendations reviewed with patient; age-appropriate recommendations, preventive care, screening tests, etc discussed and encouraged; healthy living encouraged; see AVS for patient education given to patient      Relevant Orders   Lipid panel   CBC with Differential/Platelet   TSH   COMPLETE METABOLIC PANEL WITH GFR       Meds ordered this encounter  Medications  . CAMILA 0.35 MG tablet    Sig: Take 1 tablet by mouth daily.    Refill:  3   Orders Placed This Encounter  Procedures  . Lipid panel  . CBC with Differential/Platelet  . TSH  . COMPLETE METABOLIC PANEL WITH GFR    Follow up plan: Return in about 1 year (around 11/17/2017) for complete physical; sooner if needed.  An After Visit Summary was printed and given to the patient.

## 2016-11-17 NOTE — Assessment & Plan Note (Signed)
Managed by GYN

## 2016-11-17 NOTE — Assessment & Plan Note (Signed)
Suggested anxiety coping strategies, relaxation response; she is seeing a counselor with daughter

## 2016-11-18 LAB — COMPLETE METABOLIC PANEL WITH GFR
ALT: 20 U/L (ref 6–29)
AST: 16 U/L (ref 10–30)
Albumin: 4 g/dL (ref 3.6–5.1)
Alkaline Phosphatase: 48 U/L (ref 33–115)
BUN: 8 mg/dL (ref 7–25)
CALCIUM: 8.9 mg/dL (ref 8.6–10.2)
CHLORIDE: 105 mmol/L (ref 98–110)
CO2: 23 mmol/L (ref 20–31)
Creat: 0.68 mg/dL (ref 0.50–1.10)
Glucose, Bld: 97 mg/dL (ref 65–99)
POTASSIUM: 4.1 mmol/L (ref 3.5–5.3)
Sodium: 138 mmol/L (ref 135–146)
Total Bilirubin: 0.4 mg/dL (ref 0.2–1.2)
Total Protein: 7 g/dL (ref 6.1–8.1)

## 2016-11-18 LAB — LIPID PANEL
CHOL/HDL RATIO: 5.7 ratio — AB (ref <5.0)
CHOLESTEROL: 223 mg/dL — AB (ref <200)
HDL: 39 mg/dL — ABNORMAL LOW (ref >50)
LDL Cholesterol: 140 mg/dL — ABNORMAL HIGH (ref <100)
Triglycerides: 222 mg/dL — ABNORMAL HIGH (ref <150)
VLDL: 44 mg/dL — AB (ref <30)

## 2016-11-18 LAB — TSH: TSH: 1.86 mIU/L

## 2017-01-08 IMAGING — CR DG CHEST 2V
2 series · 2 of 2 positions shown · non-contrast
Comparison: None.

CLINICAL DATA: Patient awoke this morning with sore throat and cold
like symptoms. Chest pressure, fever, and cough for 1 day.

EXAM:
CHEST  2 VIEW

[chest pa]
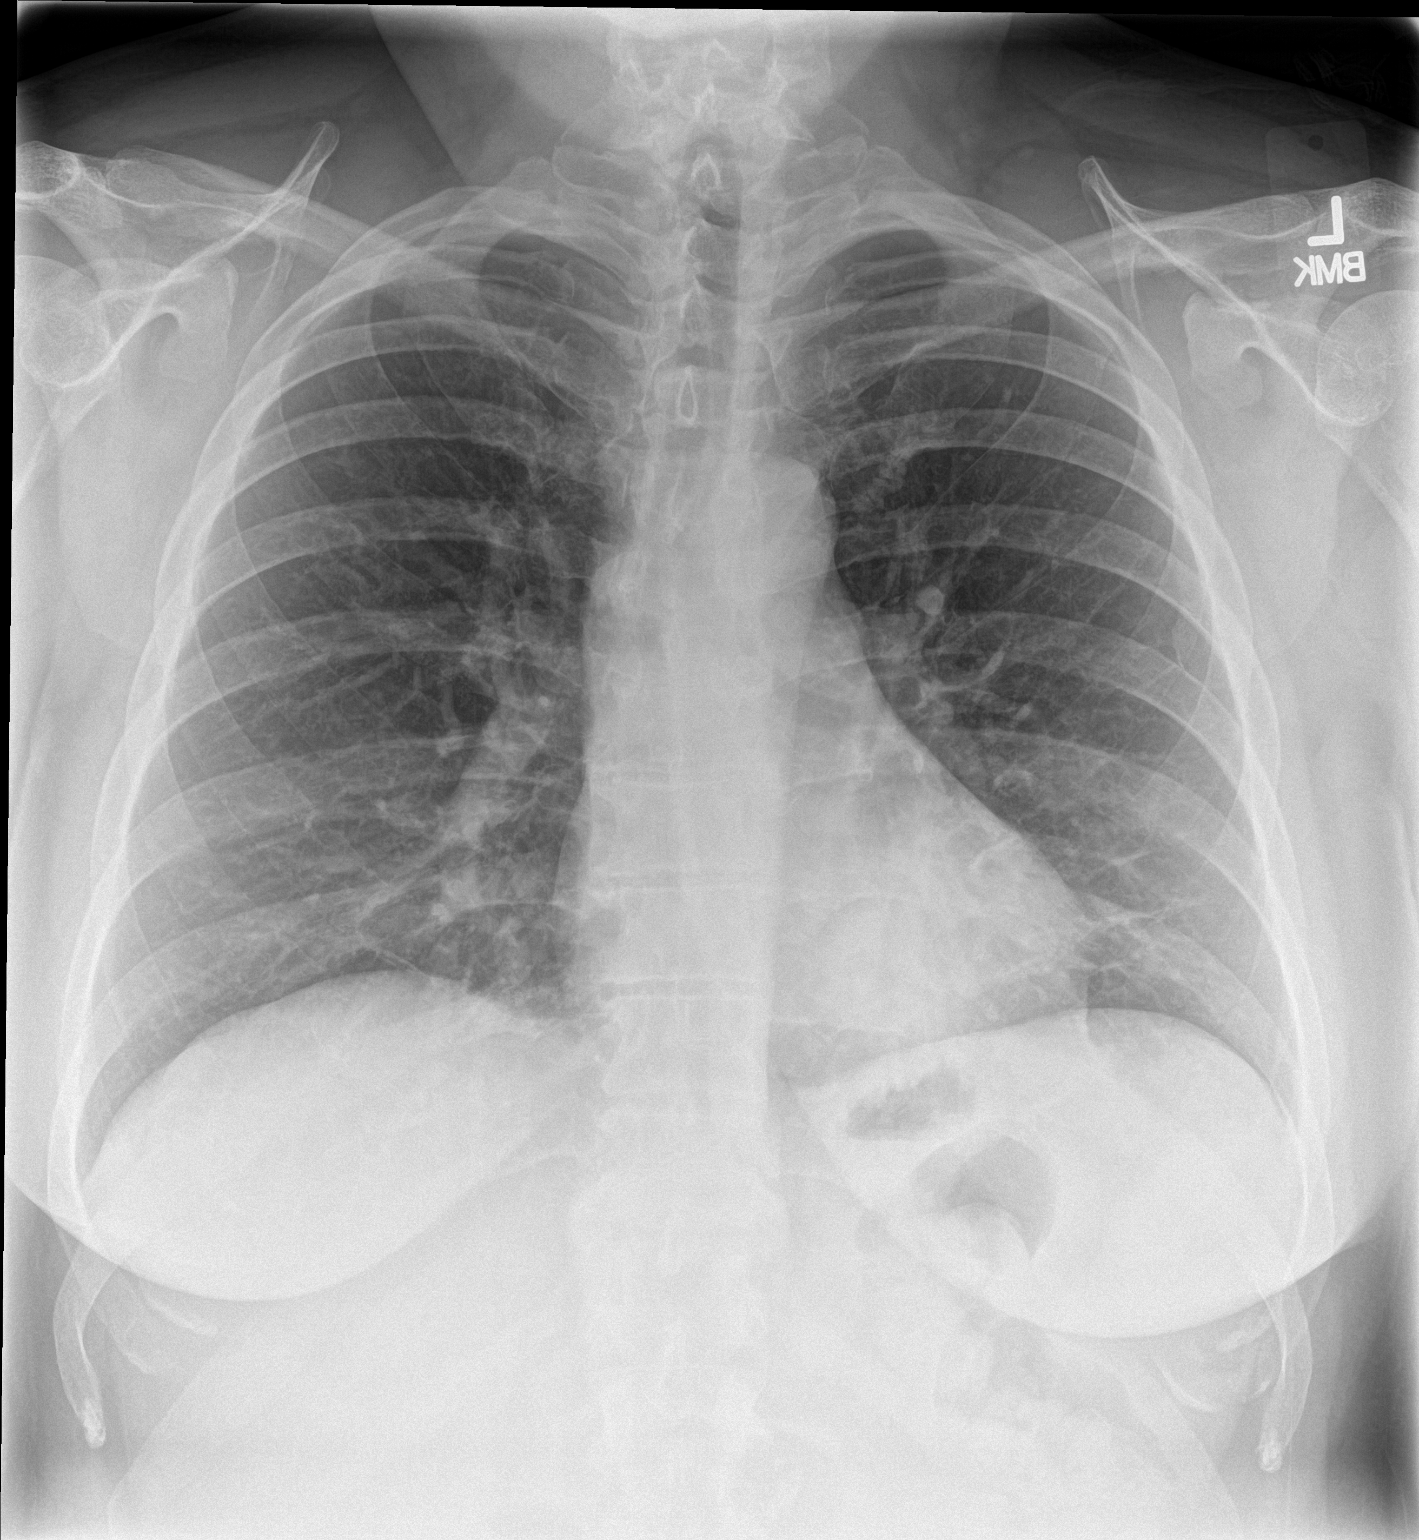

[chest lat]
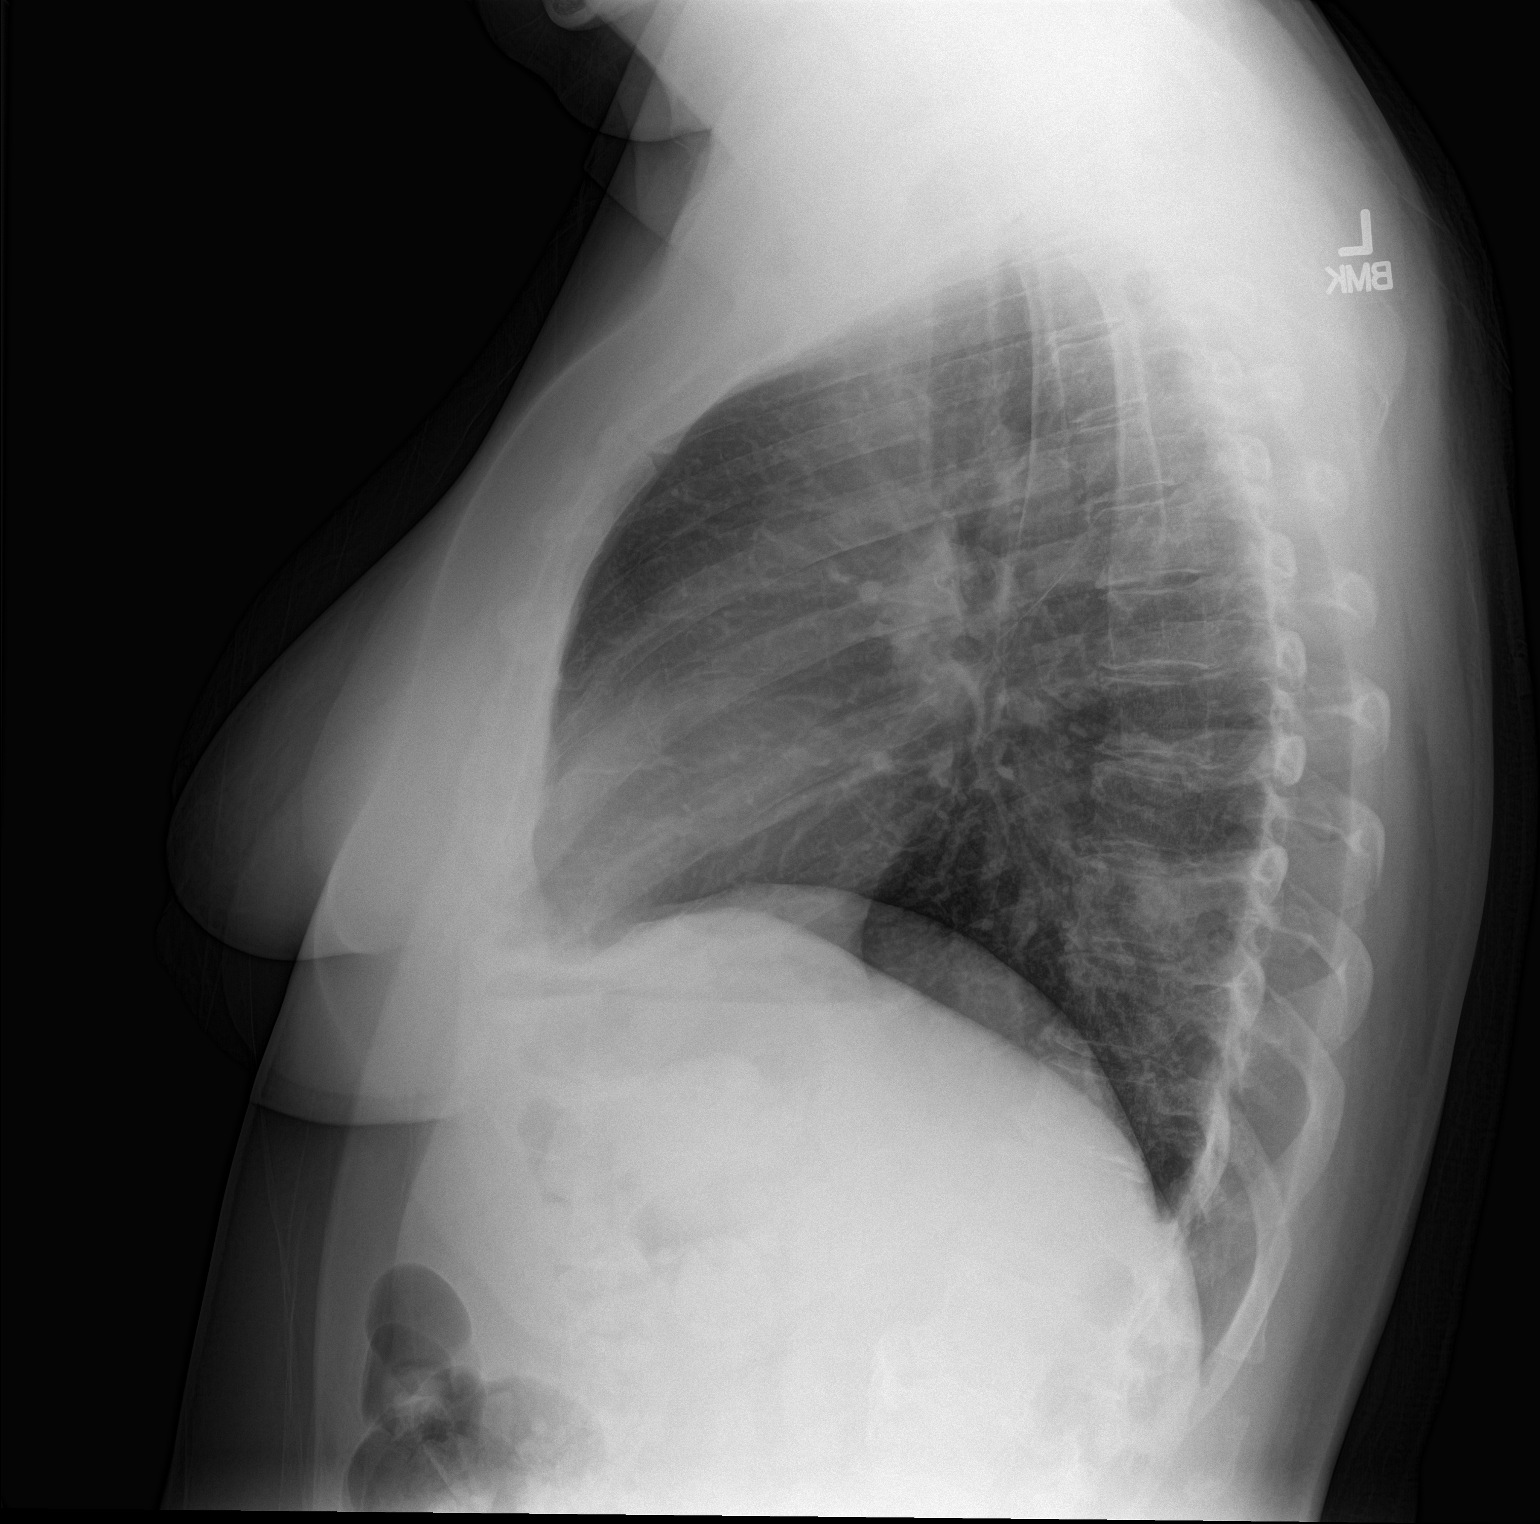

[2 of 2 positions shown; findings below may reference images not displayed]

FINDINGS: Suggestion of a small focal opacity in the left lung base behind the
heart. This could represent focal area of consolidation given the
clinical setting. Followup PA and lateral chest X-ray is recommended
in 3-4 weeks following trial of antibiotic therapy to ensure
resolution and exclude underlying malignancy. Right lung is clear
and expanded. Normal heart size and pulmonary vascularity.
Mediastinal contours appear intact. No blunting of costophrenic
angles. No pneumothorax.
IMPRESSION: Focal opacity in the left lung base behind the heart probably
representing focal pneumonia. Followup PA and lateral chest X-ray is
recommended in 3-4 weeks following trial of antibiotic therapy to
ensure resolution and exclude underlying malignancy.

## 2017-08-30 ENCOUNTER — Telehealth: Payer: Self-pay

## 2017-08-30 NOTE — Telephone Encounter (Signed)
Physical forms to fill out for pt is in Dr.Lada in box on her desk.

## 2017-09-23 ENCOUNTER — Other Ambulatory Visit: Payer: Self-pay | Admitting: Obstetrics and Gynecology

## 2017-11-19 ENCOUNTER — Ambulatory Visit (INDEPENDENT_AMBULATORY_CARE_PROVIDER_SITE_OTHER): Payer: Commercial Managed Care - PPO | Admitting: Obstetrics and Gynecology

## 2017-11-19 ENCOUNTER — Other Ambulatory Visit: Payer: Self-pay

## 2017-11-19 ENCOUNTER — Encounter: Payer: Self-pay | Admitting: Obstetrics and Gynecology

## 2017-11-19 DIAGNOSIS — Z1231 Encounter for screening mammogram for malignant neoplasm of breast: Secondary | ICD-10-CM

## 2017-11-19 DIAGNOSIS — Z01419 Encounter for gynecological examination (general) (routine) without abnormal findings: Secondary | ICD-10-CM | POA: Diagnosis not present

## 2017-11-19 DIAGNOSIS — Z1239 Encounter for other screening for malignant neoplasm of breast: Secondary | ICD-10-CM

## 2017-11-19 DIAGNOSIS — Z124 Encounter for screening for malignant neoplasm of cervix: Secondary | ICD-10-CM | POA: Diagnosis not present

## 2017-11-19 MED ORDER — CAMILA 0.35 MG PO TABS: 1.0000 | ORAL_TABLET | Freq: Every day | ORAL | 3 refills | Status: DC

## 2017-11-19 NOTE — Progress Notes (Signed)
Gynecology Annual Exam  PCP: Kerman PasseyLada, Melinda P, MD  Chief Complaint:  Chief Complaint  Patient presents with  . Gynecologic Exam    No complaints    History of Present Illness: Patient is a 46 y.o. No obstetric history on file. presents for annual exam. The patient has no complaints today.   LMP: Patient's last menstrual period was 11/12/2017. Average Interval: regular, 30 days Duration of flow: 4 days Heavy Menses: no Clots: no Intermenstrual Bleeding: no Postcoital Bleeding: no Dysmenorrhea: no   The patient is sexually active. She currently uses oral progesterone-only contraceptive for contraception. She denies dyspareunia.  The patient does perform self breast exams.  There is no notable family history of breast or ovarian cancer in her family.  The patient wears seatbelts: yes.   The patient has regular exercise: not asked.    The patient denies current symptoms of depression.    Review of Systems: Review of Systems  Constitutional: Negative for chills and fever.  HENT: Negative for congestion.   Respiratory: Negative for cough and shortness of breath.   Cardiovascular: Negative for chest pain and palpitations.  Gastrointestinal: Negative for abdominal pain, constipation, diarrhea, heartburn, nausea and vomiting.  Genitourinary: Negative for dysuria, frequency and urgency.  Skin: Negative for itching and rash.  Neurological: Negative for dizziness and headaches.  Endo/Heme/Allergies: Negative for polydipsia.  Psychiatric/Behavioral: Negative for depression.    Past Medical History:  Past Medical History:  Diagnosis Date  . Depression     Past Surgical History:  Past Surgical History:  Procedure Laterality Date  . CESAREAN SECTION      Gynecologic History:  Patient's last menstrual period was 11/12/2017. Contraception: oral progesterone-only contraceptive Last Pap: Results were: 03/14/2016 [facility] HR HPV positive  Colposcopy: 08/25/2016 CIN I Last  mammogram: 11/20/2014 Results were: BI-RAD I   Obstetric History: No obstetric history on file.  Family History:  Family History  Problem Relation Age of Onset  . Diabetes Mother   . Hyperlipidemia Mother   . COPD Father   . Depression Daughter     Social History:  Social History   Socioeconomic History  . Marital status: Divorced    Spouse name: Not on file  . Number of children: Not on file  . Years of education: Not on file  . Highest education level: Not on file  Social Needs  . Financial resource strain: Not on file  . Food insecurity - worry: Not on file  . Food insecurity - inability: Not on file  . Transportation needs - medical: Not on file  . Transportation needs - non-medical: Not on file  Occupational History  . Not on file  Tobacco Use  . Smoking status: Never Smoker  . Smokeless tobacco: Never Used  Substance and Sexual Activity  . Alcohol use: No  . Drug use: No  . Sexual activity: Yes  Other Topics Concern  . Not on file  Social History Narrative  . Not on file    Allergies:  Allergies  Allergen Reactions  . Penicillins Rash    Medications: Prior to Admission medications   Medication Sig Start Date End Date Taking? Authorizing Provider  CAMILA 0.35 MG tablet Take 1 tablet by mouth daily. 10/17/16   [provider]    Physical Exam Vitals: Blood pressure 118/76, pulse 86, height 5' 5.5" (1.664 m), weight 217 lb (98.4 kg), last menstrual period 11/12/2017.  General: NAD HEENT: normocephalic, anicteric Thyroid: no enlargement, no palpable nodules Pulmonary: No  increased work of breathing, CTAB Cardiovascular: RRR, distal pulses 2+ Breast: Breast symmetrical, no tenderness, no palpable nodules or masses, no skin or nipple retraction present, no nipple discharge.  No axillary or supraclavicular lymphadenopathy. Abdomen: NABS, soft, non-tender, non-distended.  Umbilicus without lesions.  No hepatomegaly, splenomegaly or masses  palpable. No evidence of hernia  Genitourinary:  External: Normal external female genitalia.  Normal urethral meatus, normal  Bartholin's and Skene's glands.    Vagina: Normal vaginal mucosa, no evidence of prolapse.    Cervix: Grossly normal in appearance, no bleeding  Uterus: Non-enlarged, mobile, normal contour.  No CMT  Adnexa: ovaries non-enlarged, no adnexal masses  Rectal: deferred  Lymphatic: no evidence of inguinal lymphadenopathy Extremities: no edema, erythema, or tenderness Neurologic: Grossly intact Psychiatric: mood appropriate, affect full  Female chaperone present for pelvic and breast  portions of the physical exam    Assessment: 46 y.o. No obstetric history on file. routine annual exam  Plan: Problem List Items Addressed This Visit    None      1) Mammogram - recommend yearly screening mammogram.  Mammogram Was ordered today  2) STI screening was not offered  3) ASCCP guidelines and rational discussed.  Patient opts for yearly screening interval  4) Contraception - progestin only contraceptives  5) Colonoscopy --  Per PCP  6) Routine healthcare maintenance including cholesterol, diabetes screening discussed managed by PCP

## 2017-11-19 NOTE — Patient Instructions (Signed)
Preventive Care 18-39 Years, Female Preventive care refers to lifestyle choices and visits with your health care provider that can promote health and wellness. What does preventive care include?  A yearly physical exam. This is also called an annual well check.  Dental exams once or twice a year.  Routine eye exams. Ask your health care provider how often you should have your eyes checked.  Personal lifestyle choices, including: ? Daily care of your teeth and gums. ? Regular physical activity. ? Eating a healthy diet. ? Avoiding tobacco and drug use. ? Limiting alcohol use. ? Practicing safe sex. ? Taking vitamin and mineral supplements as recommended by your health care provider. What happens during an annual well check? The services and screenings done by your health care provider during your annual well check will depend on your age, overall health, lifestyle risk factors, and family history of disease. Counseling Your health care provider may ask you questions about your:  Alcohol use.  Tobacco use.  Drug use.  Emotional well-being.  Home and relationship well-being.  Sexual activity.  Eating habits.  Work and work Statistician.  Method of birth control.  Menstrual cycle.  Pregnancy history.  Screening You may have the following tests or measurements:  Height, weight, and BMI.  Diabetes screening. This is done by checking your blood sugar (glucose) after you have not eaten for a while (fasting).  Blood pressure.  Lipid and cholesterol levels. These may be checked every 5 years starting at age 26.  Skin check.  Hepatitis C blood test.  Hepatitis B blood test.  Sexually transmitted disease (STD) testing.  BRCA-related cancer screening. This may be done if you have a family history of breast, ovarian, tubal, or peritoneal cancers.  Pelvic exam and Pap test. This may be done every 3 years starting at age 51. Starting at age 35, this may be done every 5  years if you have a Pap test in combination with an HPV test.  Discuss your test results, treatment options, and if necessary, the need for more tests with your health care provider. Vaccines Your health care provider may recommend certain vaccines, such as:  Influenza vaccine. This is recommended every year.  Tetanus, diphtheria, and acellular pertussis (Tdap, Td) vaccine. You may need a Td booster every 10 years.  Varicella vaccine. You may need this if you have not been vaccinated.  HPV vaccine. If you are 10 or younger, you may need three doses over 6 months.  Measles, mumps, and rubella (MMR) vaccine. You may need at least one dose of MMR. You may also need a second dose.  Pneumococcal 13-valent conjugate (PCV13) vaccine. You may need this if you have certain conditions and were not previously vaccinated.  Pneumococcal polysaccharide (PPSV23) vaccine. You may need one or two doses if you smoke cigarettes or if you have certain conditions.  Meningococcal vaccine. One dose is recommended if you are age 28-21 years and a first-year college student living in a residence hall, or if you have one of several medical conditions. You may also need additional booster doses.  Hepatitis A vaccine. You may need this if you have certain conditions or if you travel or work in places where you may be exposed to hepatitis A.  Hepatitis B vaccine. You may need this if you have certain conditions or if you travel or work in places where you may be exposed to hepatitis B.  Haemophilus influenzae type b (Hib) vaccine. You may need this if  you have certain risk factors.  Talk to your health care provider about which screenings and vaccines you need and how often you need them. This information is not intended to replace advice given to you by your health care provider. Make sure you discuss any questions you have with your health care provider. Document Released: 01/30/2002 Document Revised: 08/23/2016  Document Reviewed: 10/05/2015 Elsevier Interactive Patient Education  2017 [employer].

## 2017-11-20 ENCOUNTER — Encounter: Payer: BLUE CROSS/BLUE SHIELD | Admitting: Family Medicine

## 2017-11-21 LAB — PAPIG, HPV, RFX 16/18
HPV, high-risk: POSITIVE — AB
PAP SMEAR COMMENT: 0

## 2017-12-21 ENCOUNTER — Ambulatory Visit (INDEPENDENT_AMBULATORY_CARE_PROVIDER_SITE_OTHER): Payer: Commercial Managed Care - PPO | Admitting: Obstetrics and Gynecology

## 2017-12-21 ENCOUNTER — Encounter: Payer: Self-pay | Admitting: Obstetrics and Gynecology

## 2017-12-21 VITALS — BP 112/72 | HR 90 | Ht 65.5 in | Wt 218.0 lb

## 2017-12-21 DIAGNOSIS — R87810 Cervical high risk human papillomavirus (HPV) DNA test positive: Secondary | ICD-10-CM

## 2017-12-21 DIAGNOSIS — R87610 Atypical squamous cells of undetermined significance on cytologic smear of cervix (ASC-US): Secondary | ICD-10-CM | POA: Diagnosis not present

## 2017-12-21 NOTE — Progress Notes (Signed)
   GYNECOLOGY CLINIC COLPOSCOPY PROCEDURE NOTE  47 y.o. G4P4 here for colposcopy for ASCUS with POSITIVE high risk HPV pap smear on 11/19/2017. Discussed underlying role for HPV infection in the development of cervical dysplasia, its natural history and progression/regression, need for surveillance.  Is the patient  pregnant: No LMP: Patient's last menstrual period was 12/12/2017 (exact date). Smoking status:  Metrics: Intervention Frequency ACO  Documented Smoking Status Yearly  Screened one or more times in 24 months  Cessation Counseling or  Active cessation medication Past 24 months  Past 24 months   Guideline developer: UpToDate (See UpToDate for funding source) Date Released: 2014  History of STD:  No Future fertility desired:  No  Patient given informed consent, signed copy in the chart, time out was performed.  The patient was position in dorsal lithotomy position. Speculum was placed the cervix was visualized.   After application of acetic acid colposcopic inspection of the cervix was undertaken.   Colposcopy adequate, full visualization of transformation zone: Yes no visible lesions; corresponding biopsies obtained.   ECC specimen obtained:  Yes  All specimens were labeled and sent to pathology. 12 O'Clock random biopsy no acetowhite, tenaculum used to fully visualize cervix  Patient was given post procedure instructions.  Will follow up pathology and manage accordingly.  Routine preventative health maintenance measures emphasized.  Physical Exam  Genitourinary:      Melinda AustriaAndreas Grayer Sproles, MD, Merlinda FrederickFACOG [facility], [facility]

## 2017-12-24 ENCOUNTER — Ambulatory Visit: Payer: Commercial Managed Care - PPO | Admitting: Obstetrics and Gynecology

## 2017-12-25 ENCOUNTER — Encounter: Payer: Self-pay | Admitting: Obstetrics and Gynecology

## 2017-12-25 LAB — PATHOLOGY

## 2018-03-28 ENCOUNTER — Encounter: Payer: Self-pay | Admitting: Internal Medicine

## 2018-03-28 ENCOUNTER — Ambulatory Visit (INDEPENDENT_AMBULATORY_CARE_PROVIDER_SITE_OTHER): Payer: Commercial Managed Care - PPO | Admitting: Internal Medicine

## 2018-03-28 VITALS — BP 114/76 | HR 74 | Temp 98.4°F | Ht 64.5 in | Wt 213.0 lb

## 2018-03-28 DIAGNOSIS — F339 Major depressive disorder, recurrent, unspecified: Secondary | ICD-10-CM

## 2018-03-28 DIAGNOSIS — R21 Rash and other nonspecific skin eruption: Secondary | ICD-10-CM | POA: Diagnosis not present

## 2018-03-28 MED ORDER — SERTRALINE HCL 50 MG PO TABS: 50.0000 mg | ORAL_TABLET | Freq: Every day | ORAL | 2 refills | Status: DC

## 2018-03-28 MED ORDER — NYSTATIN-TRIAMCINOLONE 100000-0.1 UNIT/GM-% EX OINT: 1.0000 "application " | TOPICAL_OINTMENT | Freq: Two times a day (BID) | CUTANEOUS | 0 refills | Status: DC

## 2018-03-28 NOTE — Progress Notes (Signed)
HPI  Depression: She reports this is situational. She got divorced in 2014, her daughters boyfriend died, her daughter tried to commit suicide, her son started having seizures. She has no interest or pleasure in doing anything anymore. She has trouble sleeping. She has tried Fluoxetine in the past with minimal results. She denies SI/HI.  She reports dry itchy skin around both nipples. This started 4 months ago. She denies breast pain or discharge from the nipple. She has a history of breast augmentation in 2015. She has tried Hydrocortisone cream OTC without any relief.   Flu: never Tetanus: ? 2015 Pap Smear: 11/2017 Vision Screening: annually Dentist: biannually Past Medical History:  Diagnosis Date  . Depression   . History of chicken pox     Current Outpatient Medications  Medication Sig Dispense Refill  . CAMILA 0.35 MG tablet Take 1 tablet (0.35 mg total) by mouth daily. 3 Package 3   No current facility-administered medications for this visit.     Allergies  Allergen Reactions  . Penicillins Rash    Family History  Problem Relation Age of Onset  . Diabetes Mother   . Hyperlipidemia Mother   . COPD Father   . Depression Daughter     Social History   Socioeconomic History  . Marital status: Divorced    Spouse name: Not on file  . Number of children: Not on file  . Years of education: Not on file  . Highest education level: Not on file  Occupational History  . Not on file  Social Needs  . Financial resource strain: Not on file  . Food insecurity:    Worry: Not on file    Inability: Not on file  . Transportation needs:    Medical: Not on file    Non-medical: Not on file  Tobacco Use  . Smoking status: Never Smoker  . Smokeless tobacco: Never Used  Substance and Sexual Activity  . Alcohol use: Yes    Comment: occasional  . Drug use: No  . Sexual activity: Yes  Lifestyle  . Physical activity:    Days per week: 3 days    Minutes per session: 30 min  .  Stress: Only a little  Relationships  . Social connections:    Talks on phone: More than three times a week    Gets together: More than three times a week    Attends religious service: 1 to 4 times per year    Active member of club or organization: No    Attends meetings of clubs or organizations: Never    Relationship status: Divorced  . Intimate partner violence:    Fear of current or ex partner: Patient refused    Emotionally abused: Patient refused    Physically abused: Patient refused    Forced sexual activity: Patient refused  Other Topics Concern  . Not on file  Social History Narrative  . Not on file    ROS:  Constitutional: Denies fever, malaise, fatigue, headache or abrupt weight changes.  HEENT: Denies eye pain, eye redness, ear pain, ringing in the ears, wax buildup, runny nose, nasal congestion, bloody nose, or sore throat. Respiratory: Denies difficulty breathing, shortness of breath, cough or sputum production.   Cardiovascular: Denies chest pain, chest tightness, palpitations or swelling in the hands or feet.  Gastrointestinal: Denies abdominal pain, bloating, constipation, diarrhea or blood in the stool.  GU: Denies frequency, urgency, pain with urination, blood in urine, odor or discharge. Musculoskeletal: Denies decrease in range  of motion, difficulty with gait, muscle pain or joint pain and swelling.  Skin: Pt reports rash around nipples. Denies redness, lesions or ulcercations.  Neurological: Denies dizziness, difficulty with memory, difficulty with speech or problems with balance and coordination.  Psych: Pt reports depression. Denies anxiety, SI/HI.  No other specific complaints in a complete review of systems (except as listed in HPI above).  PE:  BP 114/76   Pulse 74   Temp 98.4 F (36.9 C) (Oral)   Ht 5' 4.5" (1.638 m)   Wt 213 lb (96.6 kg)   LMP 02/28/2018   SpO2 98%   BMI 36.00 kg/m  Wt Readings from Last 3 Encounters:  03/28/18 213 lb (96.6  kg)  12/21/17 218 lb (98.9 kg)  11/19/17 217 lb (98.4 kg)    General: Appears her stated age, obese in NAD. Cardiovascular: Normal rate and rhythm. S1,S2 noted.  No murmur, rubs or gallops noted.  Pulmonary/Chest: Normal effort and positive vesicular breath sounds. No respiratory distress. No wheezes, rales or ronchi noted.  Skin: Scaly, dry skin noted around bilateral nipples, does not extend past the areola. Neurological: Alert and oriented.  Psychiatric: Mood and affect normal. Behavior is normal. Judgment and thought content normal.    BMET    Component Value Date/Time   NA 138 11/17/2016 1147   K 4.1 11/17/2016 1147   CL 105 11/17/2016 1147   CO2 23 11/17/2016 1147   GLUCOSE 97 11/17/2016 1147   BUN 8 11/17/2016 1147   CREATININE 0.68 11/17/2016 1147   CALCIUM 8.9 11/17/2016 1147   GFRNONAA >89 11/17/2016 1147   GFRAA >89 11/17/2016 1147    Lipid Panel     Component Value Date/Time   CHOL 223 (H) 11/17/2016 1147   TRIG 222 (H) 11/17/2016 1147   HDL 39 (L) 11/17/2016 1147   CHOLHDL 5.7 (H) 11/17/2016 1147   VLDL 44 (H) 11/17/2016 1147   LDLCALC 140 (H) 11/17/2016 1147    CBC    Component Value Date/Time   WBC 9.8 11/17/2016 1147   RBC 4.54 11/17/2016 1147   HGB 14.1 11/17/2016 1147   HCT 42.8 11/17/2016 1147   PLT 282 11/17/2016 1147   MCV 94.3 11/17/2016 1147   MCH 31.1 11/17/2016 1147   MCHC 32.9 11/17/2016 1147   RDW 13.7 11/17/2016 1147   LYMPHSABS 2,646 11/17/2016 1147   MONOABS 490 11/17/2016 1147   EOSABS 98 11/17/2016 1147   BASOSABS 0 11/17/2016 1147    Hgb A1C No results found for: HGBA1C   Assessment and Plan:  Rash of Breast:  ? Eczema vs Yeast eRx for Nystatin-Mycolog Ointment BID prn  Make an appt for your annual exam Nicki ReaperBAITY, Takahiro Godinho, NP

## 2018-03-28 NOTE — Assessment & Plan Note (Signed)
Situational She declines referral for therapy at this time Will trail Sertraline- side effects vs benefits discussed  Update me in 1 month and let me know how you are doing on Sertraline

## 2018-03-28 NOTE — Patient Instructions (Signed)

## 2018-04-19 ENCOUNTER — Other Ambulatory Visit: Payer: Self-pay | Admitting: Internal Medicine

## 2018-07-20 ENCOUNTER — Other Ambulatory Visit: Payer: Self-pay | Admitting: Internal Medicine

## 2018-08-26 ENCOUNTER — Encounter: Payer: Self-pay | Admitting: Internal Medicine

## 2018-08-26 ENCOUNTER — Ambulatory Visit (INDEPENDENT_AMBULATORY_CARE_PROVIDER_SITE_OTHER): Payer: Commercial Managed Care - PPO | Admitting: Internal Medicine

## 2018-08-26 ENCOUNTER — Encounter: Payer: Commercial Managed Care - PPO | Admitting: Internal Medicine

## 2018-08-26 VITALS — BP 116/74 | HR 71 | Temp 98.7°F | Ht 64.5 in | Wt 200.0 lb

## 2018-08-26 DIAGNOSIS — Z0000 Encounter for general adult medical examination without abnormal findings: Secondary | ICD-10-CM | POA: Diagnosis not present

## 2018-08-26 DIAGNOSIS — R002 Palpitations: Secondary | ICD-10-CM | POA: Diagnosis not present

## 2018-08-26 DIAGNOSIS — F339 Major depressive disorder, recurrent, unspecified: Secondary | ICD-10-CM | POA: Diagnosis not present

## 2018-08-26 DIAGNOSIS — L659 Nonscarring hair loss, unspecified: Secondary | ICD-10-CM

## 2018-08-26 LAB — LIPID PANEL
Cholesterol: 157 mg/dL (ref 0–200)
HDL: 36.6 mg/dL — ABNORMAL LOW (ref >39.00)
LDL Cholesterol: 101 mg/dL — ABNORMAL HIGH (ref 0–99)
NONHDL: 120.72
Total CHOL/HDL Ratio: 4
Triglycerides: 99 mg/dL (ref 0.0–149.0)
VLDL: 19.8 mg/dL (ref 0.0–40.0)

## 2018-08-26 LAB — COMPREHENSIVE METABOLIC PANEL
ALK PHOS: 52 U/L (ref 39–117)
ALT: 14 U/L (ref 0–35)
AST: 15 U/L (ref 0–37)
Albumin: 4.4 g/dL (ref 3.5–5.2)
BILIRUBIN TOTAL: 0.7 mg/dL (ref 0.2–1.2)
BUN: 10 mg/dL (ref 6–23)
CO2: 28 mEq/L (ref 19–32)
Calcium: 9.4 mg/dL (ref 8.4–10.5)
Chloride: 102 mEq/L (ref 96–112)
Creatinine, Ser: 0.97 mg/dL (ref 0.40–1.20)
GFR: 65.57 mL/min (ref >60.00)
GLUCOSE: 95 mg/dL (ref 70–99)
Potassium: 3.8 mEq/L (ref 3.5–5.1)
SODIUM: 138 meq/L (ref 135–145)
TOTAL PROTEIN: 7.7 g/dL (ref 6.0–8.3)

## 2018-08-26 LAB — CBC
HCT: 41.6 % (ref 36.0–46.0)
HEMOGLOBIN: 14.3 g/dL (ref 12.0–15.0)
MCHC: 34.3 g/dL (ref 30.0–36.0)
MCV: 91.1 fl (ref 78.0–100.0)
Platelets: 285 10*3/uL (ref 150.0–400.0)
RBC: 4.56 Mil/uL (ref 3.87–5.11)
RDW: 13.8 % (ref 11.5–15.5)
WBC: 8.6 10*3/uL (ref 4.0–10.5)

## 2018-08-26 LAB — VITAMIN D 25 HYDROXY (VIT D DEFICIENCY, FRACTURES): VITD: 29.74 ng/mL — AB (ref 30.00–100.00)

## 2018-08-26 LAB — TSH: TSH: 2.36 u[IU]/mL (ref 0.35–4.50)

## 2018-08-26 NOTE — Progress Notes (Signed)
Subjective:    Patient ID: Melinda Roach, female    DOB: Jun 11, 1971, 47 y.o.   MRN: [id number]  HPI  Pt presents to the clinic today for her annual exam.  Depression: Recurrent. She is taking Sertraline as prescribed. She denies anxiety, SI/HI.  Flu: gets annually at work Tetanus: ? 2015 Pap Smear: 11/2017 Mammogram: ?2016, ordered, she just needs to schedule Dentist: biannually Vision Screening: annually  Diet: She does eat meat. She consumes fruits and veggies daily. She does not eat fried foods. She drinks flavored water, milk or tea. Exercise: Walking  Review of Systems      Past Medical History:  Diagnosis Date  . Depression   . History of chicken pox     Current Outpatient Medications  Medication Sig Dispense Refill  . CAMILA 0.35 MG tablet Take 1 tablet (0.35 mg total) by mouth daily. 3 Package 3  . nystatin-triamcinolone ointment (MYCOLOG) Apply 1 application topically 2 (two) times daily. 30 g 0  . sertraline (ZOLOFT) 50 MG tablet TAKE 1 TABLET BY MOUTH EVERY DAY 90 tablet 0   No current facility-administered medications for this visit.     Allergies  Allergen Reactions  . Penicillins Rash    Family History  Problem Relation Age of Onset  . Diabetes Mother   . Hyperlipidemia Mother   . COPD Father   . Depression Daughter     Social History   Socioeconomic History  . Marital status: Divorced    Spouse name: Not on file  . Number of children: Not on file  . Years of education: Not on file  . Highest education level: Not on file  Occupational History  . Not on file  Social Needs  . Financial resource strain: Not on file  . Food insecurity:    Worry: Not on file    Inability: Not on file  . Transportation needs:    Medical: Not on file    Non-medical: Not on file  Tobacco Use  . Smoking status: Never Smoker  . Smokeless tobacco: Never Used  Substance and Sexual Activity  . Alcohol use: Yes    Comment: occasional  . Drug use: No  .  Sexual activity: Yes  Lifestyle  . Physical activity:    Days per week: 3 days    Minutes per session: 30 min  . Stress: Only a little  Relationships  . Social connections:    Talks on phone: More than three times a week    Gets together: More than three times a week    Attends religious service: 1 to 4 times per year    Active member of club or organization: No    Attends meetings of clubs or organizations: Never    Relationship status: Divorced  . Intimate partner violence:    Fear of current or ex partner: Patient refused    Emotionally abused: Patient refused    Physically abused: Patient refused    Forced sexual activity: Patient refused  Other Topics Concern  . Not on file  Social History Narrative  . Not on file     Constitutional: Denies fever, malaise, fatigue, headache or abrupt weight changes.  HEENT: Denies eye pain, eye redness, ear pain, ringing in the ears, wax buildup, runny nose, nasal congestion, bloody nose, or sore throat. Respiratory: Denies difficulty breathing, shortness of breath, cough or sputum production.   Cardiovascular: Pt reports intermittent palpitations. Denies chest pain, chest tightness, or swelling in the hands or feet.  Gastrointestinal: Denies abdominal pain, bloating, constipation, diarrhea or blood in the stool.  GU: Denies urgency, frequency, pain with urination, burning sensation, blood in urine, odor or discharge. Musculoskeletal: Denies decrease in range of motion, difficulty with gait, muscle pain or joint pain and swelling.  Skin: Pt reports thinning hair. Denies redness, rashes, lesions or ulcercations.  Neurological: Denies dizziness, difficulty with memory, difficulty with speech or problems with balance and coordination.  Psych: Pt reports history of depression. Denies anxiety, depression, SI/HI.  No other specific complaints in a complete review of systems (except as listed in HPI above).  Objective:   Physical Exam   BP  116/74   Pulse 71   Temp 98.7 F (37.1 C) (Oral)   Ht 5' 4.5" (1.638 m)   Wt 200 lb (90.7 kg)   LMP 08/20/2018   SpO2 98%   BMI 33.80 kg/m  Wt Readings from Last 3 Encounters:  08/26/18 200 lb (90.7 kg)  03/28/18 213 lb (96.6 kg)  12/21/17 218 lb (98.9 kg)    General: Appears her stated age, obese in NAD. Skin: Warm, dry and intact.  HEENT: Head: normal shape and size; Eyes: sclera white, no icterus, conjunctiva pink, PERRLA and EOMs intact; Ears: Tm's gray and intact, normal light reflex;  Throat/Mouth: Teeth present, mucosa pink and moist, no exudate, lesions or ulcerations noted.  Neck:  Neck supple, trachea midline. No masses, lumps or thyromegaly present.  Cardiovascular: Normal rate and rhythm. S1,S2 noted.  No murmur, rubs or gallops noted. No JVD or BLE edema.  Pulmonary/Chest: Normal effort and positive vesicular breath sounds. No respiratory distress. No wheezes, rales or ronchi noted.  Abdomen: Soft and nontender. Normal bowel sounds. No distention or masses noted. Liver, spleen and kidneys non palpable. Musculoskeletal: Strength 5/5 BUE/BLE. No difficulty with gait.  Neurological: Alert and oriented. Cranial nerves II-XII grossly intact. Coordination normal.  Psychiatric: Mood and affect normal. Behavior is normal. Judgment and thought content normal.     BMET    Component Value Date/Time   NA 138 11/17/2016 1147   K 4.1 11/17/2016 1147   CL 105 11/17/2016 1147   CO2 23 11/17/2016 1147   GLUCOSE 97 11/17/2016 1147   BUN 8 11/17/2016 1147   CREATININE 0.68 11/17/2016 1147   CALCIUM 8.9 11/17/2016 1147   GFRNONAA >89 11/17/2016 1147   GFRAA >89 11/17/2016 1147    Lipid Panel     Component Value Date/Time   CHOL 223 (H) 11/17/2016 1147   TRIG 222 (H) 11/17/2016 1147   HDL 39 (L) 11/17/2016 1147   CHOLHDL 5.7 (H) 11/17/2016 1147   VLDL 44 (H) 11/17/2016 1147   LDLCALC 140 (H) 11/17/2016 1147    CBC    Component Value Date/Time   WBC 9.8 11/17/2016  1147   RBC 4.54 11/17/2016 1147   HGB 14.1 11/17/2016 1147   HCT 42.8 11/17/2016 1147   PLT 282 11/17/2016 1147   MCV 94.3 11/17/2016 1147   MCH 31.1 11/17/2016 1147   MCHC 32.9 11/17/2016 1147   RDW 13.7 11/17/2016 1147   LYMPHSABS 2,646 11/17/2016 1147   MONOABS 490 11/17/2016 1147   EOSABS 98 11/17/2016 1147   BASOSABS 0 11/17/2016 1147    Hgb A1C No results found for: HGBA1C         Assessment & Plan:   Preventative Health Maintenance:  Encouraged her to get a flu shot in the past She believes her tetanus is UTD Pap smear UTD She will call to  schedule her mammogram Encouraged her to consume a balanced diet and exercise regimen Advised her to see an eye doctor and dentist annually Will check CBC, CMET, Lipid, A1C  Palpitations, Thinning Hair:  TSH today  Will follow up after labs, RTC in 1 year, sooner if needed Nicki Reaper, NP

## 2018-08-26 NOTE — Assessment & Plan Note (Signed)
She is considering increasing her dose to 2 tabs (100 mg) just to see if more effective If she does okay on this dose, she will call me for a new RX Support offered today

## 2018-08-26 NOTE — Patient Instructions (Signed)

## 2018-09-10 ENCOUNTER — Encounter: Payer: Self-pay | Admitting: Internal Medicine

## 2018-09-10 MED ORDER — SERTRALINE HCL 100 MG PO TABS: 100.0000 mg | ORAL_TABLET | Freq: Every day | ORAL | 1 refills | Status: DC

## 2018-09-23 ENCOUNTER — Encounter: Payer: Commercial Managed Care - PPO | Admitting: Internal Medicine

## 2018-10-20 ENCOUNTER — Other Ambulatory Visit: Payer: Self-pay | Admitting: Obstetrics and Gynecology

## 2018-10-21 NOTE — Telephone Encounter (Signed)
Please advise, pt had Colpo in January. Annual due 11/2018

## 2018-11-25 ENCOUNTER — Ambulatory Visit (INDEPENDENT_AMBULATORY_CARE_PROVIDER_SITE_OTHER): Payer: Commercial Managed Care - PPO | Admitting: Obstetrics and Gynecology

## 2018-11-25 ENCOUNTER — Other Ambulatory Visit (HOSPITAL_COMMUNITY)
Admission: RE | Admit: 2018-11-25 | Discharge: 2018-11-25 | Disposition: A | Discharge status: Home / Self Care | Payer: Commercial Managed Care - PPO | Source: Ambulatory Visit | Attending: Obstetrics and Gynecology | Admitting: Obstetrics and Gynecology

## 2018-11-25 ENCOUNTER — Encounter: Payer: Self-pay | Admitting: Obstetrics and Gynecology

## 2018-11-25 VITALS — BP 120/70 | HR 90 | Ht 65.5 in | Wt 203.0 lb

## 2018-11-25 DIAGNOSIS — Z01419 Encounter for gynecological examination (general) (routine) without abnormal findings: Secondary | ICD-10-CM | POA: Diagnosis not present

## 2018-11-25 DIAGNOSIS — Z124 Encounter for screening for malignant neoplasm of cervix: Secondary | ICD-10-CM | POA: Insufficient documentation

## 2018-11-25 DIAGNOSIS — Z1239 Encounter for other screening for malignant neoplasm of breast: Secondary | ICD-10-CM

## 2018-11-25 NOTE — Progress Notes (Signed)
Gynecology Annual Exam  PCP: Lorre Munroe, NP  Chief Complaint:  Chief Complaint  Patient presents with  . Gynecologic Exam    History of Present Illness: Patient is a 47 y.o. G4P4 presents for annual exam. The patient has no complaints today.   LMP: Patient's last menstrual period was 11/08/2018 (exact date). Average Interval: regular, 28 days Duration of flow: 5 days Heavy Menses: no Clots: no Intermenstrual Bleeding: no Postcoital Bleeding: no Dysmenorrhea: no  The patient does perform self breast exams.  There is no notable family history of breast or ovarian cancer in her family.  The patient wears seatbelts: yes.   The patient has regular exercise: not asked.    The patient denies current symptoms of depression.    Review of Systems: Review of Systems  Constitutional: Negative for chills and fever.  HENT: Negative for congestion.   Respiratory: Negative for cough and shortness of breath.   Cardiovascular: Negative for chest pain and palpitations.  Gastrointestinal: Negative for abdominal pain, constipation, diarrhea, heartburn, nausea and vomiting.  Genitourinary: Negative for dysuria, frequency and urgency.  Skin: Negative for itching and rash.  Neurological: Negative for dizziness and headaches.  Endo/Heme/Allergies: Negative for polydipsia.  Psychiatric/Behavioral: Negative for depression.    Past Medical History:  Past Medical History:  Diagnosis Date  . Depression   . History of chicken pox     Past Surgical History:  Past Surgical History:  Procedure Laterality Date  . BREAST ENHANCEMENT SURGERY    . CESAREAN SECTION    . LABIOPLASTY      Gynecologic History:  Patient's last menstrual period was 11/08/2018 (exact date). Contraception: none Last Pap: Results were:  12/21/2017 Colposcopy negative 11/19/2017 ASCUS with POSITIVE high risk HPV  08/25/2016 Colposcopy CIN I 03/14/2016 LSIL HR HPV positive   Last mammogram: 11/11/2108 Results  were: BI-RAD II  Obstetric History: G4P4  Family History:  Family History  Problem Relation Age of Onset  . Diabetes Mother   . Hyperlipidemia Mother   . COPD Father   . Depression Daughter     Social History:  Social History   Socioeconomic History  . Marital status: Divorced    Spouse name: Not on file  . Number of children: Not on file  . Years of education: Not on file  . Highest education level: Not on file  Occupational History  . Not on file  Social Needs  . Financial resource strain: Not on file  . Food insecurity:    Worry: Not on file    Inability: Not on file  . Transportation needs:    Medical: Not on file    Non-medical: Not on file  Tobacco Use  . Smoking status: Never Smoker  . Smokeless tobacco: Never Used  Substance and Sexual Activity  . Alcohol use: Yes    Comment: occasional  . Drug use: No  . Sexual activity: Yes  Lifestyle  . Physical activity:    Days per week: 3 days    Minutes per session: 30 min  . Stress: Only a little  Relationships  . Social connections:    Talks on phone: More than three times a week    Gets together: More than three times a week    Attends religious service: 1 to 4 times per year    Active member of club or organization: No    Attends meetings of clubs or organizations: Never    Relationship status: Divorced  . Intimate partner  violence:    Fear of current or ex partner: Patient refused    Emotionally abused: Patient refused    Physically abused: Patient refused    Forced sexual activity: Patient refused  Other Topics Concern  . Not on file  Social History Narrative  . Not on file    Allergies:  Allergies  Allergen Reactions  . Penicillins Rash    Medications: Prior to Admission medications   Medication Sig Start Date End Date Taking? Authorizing Provider  sertraline (ZOLOFT) 100 MG tablet Take 1 tablet (100 mg total) by mouth daily. 09/10/18  Yes Lorre MunroeBaity, Regina W, NP    Physical Exam Vitals:  Blood pressure 120/70, pulse 90, height 5' 5.5" (1.664 m), weight 203 lb (92.1 kg), last menstrual period 11/08/2018.  General: NAD HEENT: normocephalic, anicteric Thyroid: no enlargement, no palpable nodules Pulmonary: No increased work of breathing, CTAB Cardiovascular: RRR, distal pulses 2+ Breast: Breast symmetrical, no tenderness, no palpable nodules or masses, no skin or nipple retraction present, no nipple discharge.  No axillary or supraclavicular lymphadenopathy. Abdomen: NABS, soft, non-tender, non-distended.  Umbilicus without lesions.  No hepatomegaly, splenomegaly or masses palpable. No evidence of hernia  Genitourinary:  External: Normal external female genitalia.  Normal urethral meatus, normal Bartholin's and Skene's glands.    Vagina: Normal vaginal mucosa, no evidence of prolapse.    Cervix: Grossly normal in appearance, no bleeding  Uterus: Non-enlarged, mobile, normal contour.  No CMT  Adnexa: ovaries non-enlarged, no adnexal masses  Rectal: deferred  Lymphatic: no evidence of inguinal lymphadenopathy Extremities: no edema, erythema, or tenderness Neurologic: Grossly intact Psychiatric: mood appropriate, affect full  Female chaperone present for pelvic and breast  portions of the physical exam     Assessment: 47 y.o. G4P4 routine annual exam  Plan: Problem List Items Addressed This Visit    None    Visit Diagnoses    Encounter for gynecological examination without abnormal finding    -  Primary   Screening for malignant neoplasm of cervix       Relevant Orders   Cytology - PAP   Breast screening          1) Mammogram - recommend yearly screening mammogram.  Mammogram Is up to date   2) STI screening  was notoffered and therefore not obtained  3) ASCCP guidelines and rational discussed.  Patient opts for every 3 years screening interval - repeat this year to follow up on ASCUS HPV positive pap last year with negative colposcopy  4) Contraception -  the patient is currently using  none.  She is happy with her current form of contraception and plans to continue  5) Colonoscopy -- Screening recommended starting at age 46 for average risk individuals, age 46 for individuals deemed at increased risk (including African Americans) and recommended to continue until age 46.  For patient age 176-85 individualized approach is recommended.  Gold standard screening is via colonoscopy, Cologuard screening is an acceptable alternative for patient unwilling or unable to undergo colonoscopy.  "Colorectal cancer screening for average?risk adults: 2018 guideline update from the American Cancer Society"CA: A Cancer Journal for Clinicians: May 16, 2017   6) Routine healthcare maintenance including cholesterol, diabetes screening discussed managed by PCP  7) Return in about 1 year (around 11/26/2019) for annual.   Vena AustriaAndreas Zarea Diesing, MD, Merlinda FrederickFACOG [facility], [facility] 11/25/2018, 3:01 PM

## 2018-11-25 NOTE — Patient Instructions (Signed)
Preventive Care 40-64 Years, Female Preventive care refers to lifestyle choices and visits with your health care provider that can promote health and wellness. What does preventive care include?  A yearly physical exam. This is also called an annual well check.  Dental exams once or twice a year.  Routine eye exams. Ask your health care provider how often you should have your eyes checked.  Personal lifestyle choices, including: ? Daily care of your teeth and gums. ? Regular physical activity. ? Eating a healthy diet. ? Avoiding tobacco and drug use. ? Limiting alcohol use. ? Practicing safe sex. ? Taking low-dose aspirin daily starting at age 58. ? Taking vitamin and mineral supplements as recommended by your health care provider. What happens during an annual well check? The services and screenings done by your health care provider during your annual well check will depend on your age, overall health, lifestyle risk factors, and family history of disease. Counseling Your health care provider may ask you questions about your:  Alcohol use.  Tobacco use.  Drug use.  Emotional well-being.  Home and relationship well-being.  Sexual activity.  Eating habits.  Work and work Statistician.  Method of birth control.  Menstrual cycle.  Pregnancy history.  Screening You may have the following tests or measurements:  Height, weight, and BMI.  Blood pressure.  Lipid and cholesterol levels. These may be checked every 5 years, or more frequently if you are over 81 years old.  Skin check.  Lung cancer screening. You may have this screening every year starting at age 78 if you have a 30-pack-year history of smoking and currently smoke or have quit within the past 15 years.  Fecal occult blood test (FOBT) of the stool. You may have this test every year starting at age 65.  Flexible sigmoidoscopy or colonoscopy. You may have a sigmoidoscopy every 5 years or a colonoscopy  every 10 years starting at age 30.  Hepatitis C blood test.  Hepatitis B blood test.  Sexually transmitted disease (STD) testing.  Diabetes screening. This is done by checking your blood sugar (glucose) after you have not eaten for a while (fasting). You may have this done every 1-3 years.  Mammogram. This may be done every 1-2 years. Talk to your health care provider about when you should start having regular mammograms. This may depend on whether you have a family history of breast cancer.  BRCA-related cancer screening. This may be done if you have a family history of breast, ovarian, tubal, or peritoneal cancers.  Pelvic exam and Pap test. This may be done every 3 years starting at age 80. Starting at age 36, this may be done every 5 years if you have a Pap test in combination with an HPV test.  Bone density scan. This is done to screen for osteoporosis. You may have this scan if you are at high risk for osteoporosis.  Discuss your test results, treatment options, and if necessary, the need for more tests with your health care provider. Vaccines Your health care provider may recommend certain vaccines, such as:  Influenza vaccine. This is recommended every year.  Tetanus, diphtheria, and acellular pertussis (Tdap, Td) vaccine. You may need a Td booster every 10 years.  Varicella vaccine. You may need this if you have not been vaccinated.  Zoster vaccine. You may need this after age 5.  Measles, mumps, and rubella (MMR) vaccine. You may need at least one dose of MMR if you were born in  1957 or later. You may also need a second dose.  Pneumococcal 13-valent conjugate (PCV13) vaccine. You may need this if you have certain conditions and were not previously vaccinated.  Pneumococcal polysaccharide (PPSV23) vaccine. You may need one or two doses if you smoke cigarettes or if you have certain conditions.  Meningococcal vaccine. You may need this if you have certain  conditions.  Hepatitis A vaccine. You may need this if you have certain conditions or if you travel or work in places where you may be exposed to hepatitis A.  Hepatitis B vaccine. You may need this if you have certain conditions or if you travel or work in places where you may be exposed to hepatitis B.  Haemophilus influenzae type b (Hib) vaccine. You may need this if you have certain conditions.  Talk to your health care provider about which screenings and vaccines you need and how often you need them. This information is not intended to replace advice given to you by your health care provider. Make sure you discuss any questions you have with your health care provider. Document Released: 12/31/2015 Document Revised: 08/23/2016 Document Reviewed: 10/05/2015 Elsevier Interactive Patient Education  2018 Elsevier Inc.  

## 2018-12-05 LAB — CYTOLOGY - PAP
DIAGNOSIS: NEGATIVE
HPV 16/18/45 genotyping: NEGATIVE
HPV: DETECTED — AB

## 2018-12-09 ENCOUNTER — Ambulatory Visit: Payer: Commercial Managed Care - PPO | Admitting: Podiatry

## 2018-12-09 ENCOUNTER — Encounter: Payer: Self-pay | Admitting: Podiatry

## 2018-12-09 ENCOUNTER — Ambulatory Visit (INDEPENDENT_AMBULATORY_CARE_PROVIDER_SITE_OTHER): Payer: Commercial Managed Care - PPO

## 2018-12-09 VITALS — BP 124/82 | HR 83

## 2018-12-09 DIAGNOSIS — M779 Enthesopathy, unspecified: Secondary | ICD-10-CM

## 2018-12-09 DIAGNOSIS — M6740 Ganglion, unspecified site: Secondary | ICD-10-CM | POA: Diagnosis not present

## 2018-12-09 DIAGNOSIS — M778 Other enthesopathies, not elsewhere classified: Secondary | ICD-10-CM

## 2018-12-09 NOTE — Progress Notes (Signed)
  Subjective:  Patient ID: Melinda Roach, female    DOB: 1971/11/04,  MRN: [id number] HPI Chief Complaint  Patient presents with  . Foot Pain    Patient presents today for knot on top lateral side of left foot x 2-3 years.  She states, "it never hurts, but it became painful a couple of weeks ago for like a day then the pain went away"  She reports she has not done anything to treat it.    47 y.o. female presents with the above complaint.   ROS: Denies fever chills nausea vomiting muscle aches pains calf pain back pain chest pain shortness of breath.  Past Medical History:  Diagnosis Date  . Depression   . History of chicken pox    Past Surgical History:  Procedure Laterality Date  . BREAST ENHANCEMENT SURGERY    . CESAREAN SECTION    . LABIOPLASTY      Current Outpatient Medications:  .  sertraline (ZOLOFT) 100 MG tablet, Take 1 tablet (100 mg total) by mouth daily., Disp: 90 tablet, Rfl: 1  Allergies  Allergen Reactions  . Penicillins Rash   Review of Systems Objective:   Vitals:   12/09/18 0912  BP: 124/82  Pulse: 83    General: Well developed, nourished, in no acute distress, alert and oriented x3   Dermatological: Skin is warm, dry and supple bilateral. Nails x 10 are well maintained; remaining integument appears unremarkable at this time. There are no open sores, no preulcerative lesions, no rash or signs of infection present.  Vascular: Dorsalis Pedis artery and Posterior Tibial artery pedal pulses are 2/4 bilateral with immedate capillary fill time. Pedal hair growth present. No varicosities and no lower extremity edema present bilateral.   Neruologic: Grossly intact via light touch bilateral. Vibratory intact via tuning fork bilateral. Protective threshold with Semmes Wienstein monofilament intact to all pedal sites bilateral. Patellar and Achilles deep tendon reflexes 2+ bilateral. No Babinski or clonus noted bilateral.   Musculoskeletal: No gross boney pedal  deformities bilateral. No pain, crepitus, or limitation noted with foot and ankle range of motion bilateral. Muscular strength 5/5 in all groups tested bilateral.  Gait: Unassisted, Nonantalgic.    Radiographs:  Do not demonstrate any type of calcified lesion near the area of question.  Appears to be a soft tissue nodule dorsal aspect fifth metatarsal cuboid articulation  Assessment & Plan:   Assessment: Ganglion cyst non-complicated  Plan: Follow-up with me on an as-needed basis offered aspiration surgery declined.     Max T. BushnellHyatt, North DakotaDPM

## 2018-12-12 ENCOUNTER — Telehealth: Payer: Self-pay | Admitting: Obstetrics and Gynecology

## 2018-12-12 NOTE — Telephone Encounter (Signed)
-----   Message from Vena AustriaAndreas Staebler, MD sent at 12/12/2018  9:13 AM EST ----- Regarding: Colposcopy Colposcopy sometime in the upcoming month.

## 2018-12-12 NOTE — Telephone Encounter (Signed)
Patient is schedule 01/03/18 with AMS

## 2019-01-03 ENCOUNTER — Ambulatory Visit: Payer: Commercial Managed Care - PPO | Admitting: Obstetrics and Gynecology

## 2019-01-20 ENCOUNTER — Other Ambulatory Visit (HOSPITAL_COMMUNITY)
Admission: RE | Admit: 2019-01-20 | Discharge: 2019-01-20 | Disposition: A | Discharge status: Home / Self Care | Payer: Commercial Managed Care - PPO | Source: Ambulatory Visit | Attending: Obstetrics and Gynecology | Admitting: Obstetrics and Gynecology

## 2019-01-20 ENCOUNTER — Ambulatory Visit (INDEPENDENT_AMBULATORY_CARE_PROVIDER_SITE_OTHER): Payer: Commercial Managed Care - PPO | Admitting: Obstetrics and Gynecology

## 2019-01-20 ENCOUNTER — Encounter: Payer: Self-pay | Admitting: Obstetrics and Gynecology

## 2019-01-20 VITALS — BP 122/66 | HR 96 | Ht 65.5 in | Wt 216.0 lb

## 2019-01-20 DIAGNOSIS — N870 Mild cervical dysplasia: Secondary | ICD-10-CM | POA: Diagnosis not present

## 2019-01-20 DIAGNOSIS — R87810 Cervical high risk human papillomavirus (HPV) DNA test positive: Secondary | ICD-10-CM | POA: Diagnosis present

## 2019-01-20 NOTE — Progress Notes (Signed)
   GYNECOLOGY CLINIC COLPOSCOPY PROCEDURE NOTE  48 y.o. G4P4 here for colposcopy for NIL HPV positive  pap smear on 11/25/2018, prior pap 11/19/2017 ASCUS HPV positive followed by negative colposcopy 12/21/2017. Discussed underlying role for HPV infection in the development of cervical dysplasia, its natural history and progression/regression, need for surveillance.  Pap History: 11/25/2018 NIL HPV positive 12/21/2017 Colposcopy negative 11/19/2017 ASCUS with POSITIVE high risk HPV  08/25/2016 Colposcopy CIN I 03/14/2016 LSIL HR HPV positive 11/16/2015 LSIL HR HPV positive  Is the patient  pregnant: No LMP: No LMP recorded. Smoking status:  reports that she has never smoked. She has never used smokeless tobacco.  Patient given informed consent, signed copy in the chart, time out was performed.  The patient was position in dorsal lithotomy position. Speculum was placed the cervix was visualized.   After application of acetic acid colposcopic inspection of the cervix was undertaken.   Colposcopy adequate, full visualization of transformation zone: Yes no visible lesions; corresponding biopsies obtained.   ECC specimen obtained:  Yes  All specimens were labeled and sent to pathology.   Patient was given post procedure instructions.  Will follow up pathology and manage accordingly.  Routine preventative health maintenance measures emphasized.  OBGyn Exam      Vena Austria, MD, [name], [facility]

## 2019-03-26 ENCOUNTER — Encounter: Payer: Self-pay | Admitting: Internal Medicine

## 2019-05-10 ENCOUNTER — Other Ambulatory Visit: Payer: Self-pay | Admitting: Internal Medicine

## 2019-05-12 NOTE — Telephone Encounter (Signed)
Last filled 03/2018... please advise

## 2019-07-05 ENCOUNTER — Encounter: Payer: Self-pay | Admitting: Internal Medicine

## 2019-07-08 ENCOUNTER — Ambulatory Visit: Payer: Commercial Managed Care - PPO | Admitting: Internal Medicine

## 2019-07-08 ENCOUNTER — Encounter: Payer: Self-pay | Admitting: Internal Medicine

## 2019-07-08 ENCOUNTER — Other Ambulatory Visit: Payer: Self-pay

## 2019-07-08 VITALS — BP 124/84 | HR 100 | Temp 98.4°F | Wt 197.0 lb

## 2019-07-08 DIAGNOSIS — R002 Palpitations: Secondary | ICD-10-CM | POA: Diagnosis not present

## 2019-07-08 DIAGNOSIS — F339 Major depressive disorder, recurrent, unspecified: Secondary | ICD-10-CM

## 2019-07-08 MED ORDER — SERTRALINE HCL 50 MG PO TABS: 50.0000 mg | ORAL_TABLET | Freq: Every day | ORAL | 3 refills | Status: DC

## 2019-07-08 NOTE — Patient Instructions (Signed)
Palpitations Palpitations are feelings that your heartbeat is not normal. Your heartbeat may feel like it is:  Uneven.  Faster than normal.  Fluttering.  Skipping a beat. This is usually not a serious problem. In some cases, you may need tests to rule out any serious problems. Follow these instructions at home: Pay attention to any changes in your condition. Take these actions to help manage your symptoms: Eating and drinking  Avoid: ? Coffee, tea, soft drinks, and energy drinks. ? Chocolate. ? Alcohol. ? Diet pills. Lifestyle   Try to lower your stress. These things can help you relax: ? Yoga. ? Deep breathing and meditation. ? Exercise. ? Using words and images to create positive thoughts (guided imagery). ? Using your mind to control things in your body (biofeedback).  Do not use drugs.  Get plenty of rest and sleep. Keep a regular bed time. General instructions   Take over-the-counter and prescription medicines only as told by your doctor.  Do not use any products that contain nicotine or tobacco, such as cigarettes and e-cigarettes. If you need help quitting, ask your doctor.  Keep all follow-up visits as told by your doctor. This is important. You may need more tests if palpitations do not go away or get worse. Contact a doctor if:  Your symptoms last more than 24 hours.  Your symptoms occur more often. Get help right away if you:  Have chest pain.  Feel short of breath.  Have a very bad headache.  Feel dizzy.  Pass out (faint). Summary  Palpitations are feelings that your heartbeat is uneven or faster than normal. It may feel like your heart is fluttering or skipping a beat.  Avoid food and drinks that may cause palpitations. These include caffeine, chocolate, and alcohol.  Try to lower your stress. Do not smoke or use drugs.  Get help right away if you faint or have chest pain, shortness of breath, a severe headache, or dizziness. This  information is not intended to replace advice given to you by your health care provider. Make sure you discuss any questions you have with your health care provider. Document Released: 09/12/2008 Document Revised: 01/16/2018 Document Reviewed: 01/16/2018 Elsevier Patient Education  2020 Elsevier Inc.  

## 2019-07-08 NOTE — Progress Notes (Signed)
Subjective:    Patient ID: Gerda DissWendy Lazard, female    DOB: 12/15/1971, 48 y.o.   MRN: [id number]  HPI  Patient presents to the clinic today to follow up palpitations. She first mentioned this to me 09/2018 during her annual exam. At that time, it was intermittent. TSH and electrolytes were normal. She reports her palpitations got worse in April. She was feeling them often at work, so she would lay her head down on the desk and wait for it to pass. She reports she was recently out on vacation and got a phone call from her employer, terminating her position for "sleeping at work". She reports she was not sleeping, but was just taking deep breaths to breath through the palpitations. She has intermittent dizziness but denies chest pain, chest tightness or shortness of breath. She does not feel syncopal. She has not taken anything OTC for these symptoms. She does feel like her palpitations are increased during times of stress, and she she has been more stressed since March due to COVID. She has not seen cardiology for the same.   Review of Systems      Past Medical History:  Diagnosis Date  . Depression   . History of chicken pox     Current Outpatient Medications  Medication Sig Dispense Refill  . nystatin-triamcinolone ointment (MYCOLOG) APPLY TO AFFECTED AREA TWICE A DAY 30 g 0  . sertraline (ZOLOFT) 100 MG tablet Take 1 tablet (100 mg total) by mouth daily. (Patient not taking: Reported on 01/20/2019) 90 tablet 1   No current facility-administered medications for this visit.     Allergies  Allergen Reactions  . Penicillins Rash    Family History  Problem Relation Age of Onset  . Diabetes Mother   . Hyperlipidemia Mother   . COPD Father   . Depression Daughter     Social History   Socioeconomic History  . Marital status: Divorced    Spouse name: Not on file  . Number of children: Not on file  . Years of education: Not on file  . Highest education level: Not on file   Occupational History  . Not on file  Social Needs  . Financial resource strain: Not on file  . Food insecurity    Worry: Not on file    Inability: Not on file  . Transportation needs    Medical: Not on file    Non-medical: Not on file  Tobacco Use  . Smoking status: Never Smoker  . Smokeless tobacco: Never Used  Substance and Sexual Activity  . Alcohol use: Yes    Comment: occasional  . Drug use: No  . Sexual activity: Yes  Lifestyle  . Physical activity    Days per week: 3 days    Minutes per session: 30 min  . Stress: Only a little  Relationships  . Social connections    Talks on phone: More than three times a week    Gets together: More than three times a week    Attends religious service: 1 to 4 times per year    Active member of club or organization: No    Attends meetings of clubs or organizations: Never    Relationship status: Divorced  . Intimate partner violence    Fear of current or ex partner: Patient refused    Emotionally abused: Patient refused    Physically abused: Patient refused    Forced sexual activity: Patient refused  Other Topics Concern  . Not on  file  Social History Narrative  . Not on file     Constitutional: Pt reports fatigue. Denies fever, malaise, headache or abrupt weight changes.  Respiratory: Denies difficulty breathing, shortness of breath, cough or sputum production.   Cardiovascular: Pt reports palpitations. Denies chest pain, chest tightness, or swelling in the hands or feet.  Neurological: Pt reports intermittent dizziness. Denies difficulty with memory, difficulty with speech or problems with balance and coordination.  Psych: Pt reports stress. Denies anxiety, depression, SI/HI.  No other specific complaints in a complete review of systems (except as listed in HPI above).  Objective:   Physical Exam  BP 124/84   Pulse 100   Temp 98.4 F (36.9 C) (Oral)   Wt 197 lb (89.4 kg)   SpO2 97%   BMI 32.28 kg/m   Wt Readings  from Last 3 Encounters:  01/20/19 216 lb (98 kg)  11/25/18 203 lb (92.1 kg)  08/26/18 200 lb (90.7 kg)    General: Appears her stated age, obese, in NAD. Neck:  Neck supple, trachea midline. No masses, lumps or thyromegaly present.  Cardiovascular: Normal rate and rhythm. S1,S2 noted.  No murmur, rubs or gallops noted. Pulmonary/Chest: Normal effort and positive vesicular breath sounds. No respiratory distress. No wheezes, rales or ronchi noted.  Neurological: Alert and oriented.  Psychiatric: She is slightly anxious appearing today. Behavior is normal. Judgment and thought content normal.    BMET    Component Value Date/Time   NA 138 08/26/2018 0836   K 3.8 08/26/2018 0836   CL 102 08/26/2018 0836   CO2 28 08/26/2018 0836   GLUCOSE 95 08/26/2018 0836   BUN 10 08/26/2018 0836   CREATININE 0.97 08/26/2018 0836   CREATININE 0.68 11/17/2016 1147   CALCIUM 9.4 08/26/2018 0836   GFRNONAA >89 11/17/2016 1147   GFRAA >89 11/17/2016 1147    Lipid Panel     Component Value Date/Time   CHOL 157 08/26/2018 0836   TRIG 99.0 08/26/2018 0836   HDL 36.60 (L) 08/26/2018 0836   CHOLHDL 4 08/26/2018 0836   VLDL 19.8 08/26/2018 0836   LDLCALC 101 (H) 08/26/2018 0836    CBC    Component Value Date/Time   WBC 8.6 08/26/2018 0836   RBC 4.56 08/26/2018 0836   HGB 14.3 08/26/2018 0836   HCT 41.6 08/26/2018 0836   PLT 285.0 08/26/2018 0836   MCV 91.1 08/26/2018 0836   MCH 31.1 11/17/2016 1147   MCHC 34.3 08/26/2018 0836   RDW 13.8 08/26/2018 0836   LYMPHSABS 2,646 11/17/2016 1147   MONOABS 490 11/17/2016 1147   EOSABS 98 11/17/2016 1147   BASOSABS 0 11/17/2016 1147    Hgb A1C No results found for: HGBA1C         Assessment & Plan:   Palpitations:  Indication for ECG: palpitations Interpretation of ECG: sinus rhythm, no acute findings Comparison: 10/2015, no change Discussed starting beta blocker vs referral to cardiology for Holter monitor vs treating stress Will  restart her Sertraline 50 mg PO daily (1/2 tab x 1 week then increase to 1 tab daily) Support offered today Will monitor.  Return precautions discussed Webb Silversmith, NP

## 2019-07-09 NOTE — Assessment & Plan Note (Signed)
Deteriorated Will restart Sertraline 50 mg daily Support offered today Will monitor

## 2019-07-30 ENCOUNTER — Encounter: Payer: Self-pay | Admitting: Internal Medicine

## 2019-07-30 ENCOUNTER — Other Ambulatory Visit: Payer: Self-pay | Admitting: Internal Medicine

## 2019-07-31 ENCOUNTER — Encounter: Payer: Self-pay | Admitting: Internal Medicine

## 2019-07-31 ENCOUNTER — Ambulatory Visit: Payer: Self-pay | Admitting: Internal Medicine

## 2019-07-31 ENCOUNTER — Other Ambulatory Visit: Payer: Self-pay

## 2019-07-31 VITALS — BP 124/76 | HR 71 | Temp 98.3°F | Wt 197.0 lb

## 2019-07-31 DIAGNOSIS — R300 Dysuria: Secondary | ICD-10-CM

## 2019-07-31 DIAGNOSIS — R350 Frequency of micturition: Secondary | ICD-10-CM

## 2019-07-31 LAB — POC URINALSYSI DIPSTICK (AUTOMATED)
Blood, UA: NEGATIVE
Glucose, UA: NEGATIVE
Ketones, UA: NEGATIVE
Nitrite, UA: POSITIVE
Protein, UA: POSITIVE — AB
Spec Grav, UA: >=1.03 — AB (ref 1.010–1.025)
Urobilinogen, UA: 1 E.U./dL
pH, UA: 5 (ref 5.0–8.0)

## 2019-07-31 MED ORDER — CIPROFLOXACIN HCL 500 MG PO TABS: 500.0000 mg | ORAL_TABLET | Freq: Two times a day (BID) | ORAL | 0 refills | Status: DC

## 2019-07-31 NOTE — Progress Notes (Signed)
HPI  Pt presents to the clinic today with c/o urinary frequency and burning with urination. This started 3-4 days ago. She denies urgency, dysuria or blood in her urine. She denies vaginal complaints. She denies fever, chills, nausea or low back pain. She has not tried anything OTC. She did an online visit 1 month ago for the same, was treated with Macrobid and Pyridium with complete resolution of symptoms.   Review of Systems  Past Medical History:  Diagnosis Date  . Depression   . History of chicken pox     Family History  Problem Relation Age of Onset  . Diabetes Mother   . Hyperlipidemia Mother   . COPD Father   . Depression Daughter     Social History   Socioeconomic History  . Marital status: Divorced    Spouse name: Not on file  . Number of children: Not on file  . Years of education: Not on file  . Highest education level: Not on file  Occupational History  . Not on file  Social Needs  . Financial resource strain: Not on file  . Food insecurity    Worry: Not on file    Inability: Not on file  . Transportation needs    Medical: Not on file    Non-medical: Not on file  Tobacco Use  . Smoking status: Never Smoker  . Smokeless tobacco: Never Used  Substance and Sexual Activity  . Alcohol use: Yes    Comment: occasional  . Drug use: No  . Sexual activity: Yes  Lifestyle  . Physical activity    Days per week: 3 days    Minutes per session: 30 min  . Stress: Only a little  Relationships  . Social connections    Talks on phone: More than three times a week    Gets together: More than three times a week    Attends religious service: 1 to 4 times per year    Active member of club or organization: No    Attends meetings of clubs or organizations: Never    Relationship status: Divorced  . Intimate partner violence    Fear of current or ex partner: Patient refused    Emotionally abused: Patient refused    Physically abused: Patient refused    Forced sexual  activity: Patient refused  Other Topics Concern  . Not on file  Social History Narrative  . Not on file    Allergies  Allergen Reactions  . Penicillins Rash     Constitutional: Denies fever, malaise, fatigue, headache or abrupt weight changes.   GU: Pt reports frequency and burning sensation. Denies dysuria, blood in urine, odor or discharge. Skin: Denies redness, rashes, lesions or ulcercations.   No other specific complaints in a complete review of systems (except as listed in HPI above).    Objective:   Physical Exam  BP 124/76   Pulse 71   Temp 98.3 F (36.8 C) (Temporal)   Wt 197 lb (89.4 kg)   SpO2 98%   BMI 32.28 kg/m   Wt Readings from Last 3 Encounters:  07/08/19 197 lb (89.4 kg)  01/20/19 216 lb (98 kg)  11/25/18 203 lb (92.1 kg)    General: Appears her stated age, well developed, well nourished in NAD. Cardiovascular: Normal rate and rhythm. S1,S2 noted.   Pulmonary/Chest: Normal effort and positive vesicular breath sounds. No respiratory distress. No wheezes, rales or ronchi noted.  Abdomen: Soft. Normal bowel sounds. No distention or masses  noted.  Tender to palpation over the bladder area. No CVA tenderness.        Assessment & Plan:    Frequency, Burning Sensation:  Urinalysis: 1+ leuks, nitrites Will send urine culture eRx sent if for Cipro 500 mg BID x 5 days OK to take AZO OTC Drink plenty of fluids  RTC as needed or if symptoms persist. Webb Silversmith, NP

## 2019-07-31 NOTE — Addendum Note (Signed)
Addended by: Lurlean Nanny on: 07/31/2019 10:23 AM   Modules accepted: Orders

## 2019-07-31 NOTE — Patient Instructions (Signed)
Urinary Tract Infection, Adult A urinary tract infection (UTI) is an infection of any part of the urinary tract. The urinary tract includes:  The kidneys.  The ureters.  The bladder.  The urethra. These organs make, store, and get rid of pee (urine) in the body. What are the causes? This is caused by germs (bacteria) in your genital area. These germs grow and cause swelling (inflammation) of your urinary tract. What increases the risk? You are more likely to develop this condition if:  You have a small, thin tube (catheter) to drain pee.  You cannot control when you pee or poop (incontinence).  You are female, and: ? You use these methods to prevent pregnancy: ? A medicine that kills sperm (spermicide). ? A device that blocks sperm (diaphragm). ? You have low levels of a female hormone (estrogen). ? You are pregnant.  You have genes that add to your risk.  You are sexually active.  You take antibiotic medicines.  You have trouble peeing because of: ? A prostate that is bigger than normal, if you are female. ? A blockage in the part of your body that drains pee from the bladder (urethra). ? A kidney stone. ? A nerve condition that affects your bladder (neurogenic bladder). ? Not getting enough to drink. ? Not peeing often enough.  You have other conditions, such as: ? Diabetes. ? A weak disease-fighting system (immune system). ? Sickle cell disease. ? Gout. ? Injury of the spine. What are the signs or symptoms? Symptoms of this condition include:  Needing to pee right away (urgently).  Peeing often.  Peeing small amounts often.  Pain or burning when peeing.  Blood in the pee.  Pee that smells bad or not like normal.  Trouble peeing.  Pee that is cloudy.  Fluid coming from the vagina, if you are female.  Pain in the belly or lower back. Other symptoms include:  Throwing up (vomiting).  No urge to eat.  Feeling mixed up (confused).  Being tired  and grouchy (irritable).  A fever.  Watery poop (diarrhea). How is this treated? This condition may be treated with:  Antibiotic medicine.  Other medicines.  Drinking enough water. Follow these instructions at home:  Medicines  Take over-the-counter and prescription medicines only as told by your doctor.  If you were prescribed an antibiotic medicine, take it as told by your doctor. Do not stop taking it even if you start to feel better. General instructions  Make sure you: ? Pee until your bladder is empty. ? Do not hold pee for a long time. ? Empty your bladder after sex. ? Wipe from front to back after pooping if you are a female. Use each tissue one time when you wipe.  Drink enough fluid to keep your pee pale yellow.  Keep all follow-up visits as told by your doctor. This is important. Contact a doctor if:  You do not get better after 1-2 days.  Your symptoms go away and then come back. Get help right away if:  You have very bad back pain.  You have very bad pain in your lower belly.  You have a fever.  You are sick to your stomach (nauseous).  You are throwing up. Summary  A urinary tract infection (UTI) is an infection of any part of the urinary tract.  This condition is caused by germs in your genital area.  There are many risk factors for a UTI. These include having a small, thin   tube to drain pee and not being able to control when you pee or poop.  Treatment includes antibiotic medicines for germs.  Drink enough fluid to keep your pee pale yellow. This information is not intended to replace advice given to you by your health care provider. Make sure you discuss any questions you have with your health care provider. Document Released: 05/22/2008 Document Revised: 11/21/2018 Document Reviewed: 06/13/2018 Elsevier Patient Education  2020 Elsevier Inc.  

## 2019-08-02 LAB — URINE CULTURE
MICRO NUMBER:: 768582
SPECIMEN QUALITY:: ADEQUATE

## 2019-10-22 ENCOUNTER — Other Ambulatory Visit: Payer: Self-pay | Admitting: Internal Medicine

## 2019-12-31 ENCOUNTER — Encounter: Payer: Self-pay | Admitting: Internal Medicine

## 2020-01-01 MED ORDER — SERTRALINE HCL 100 MG PO TABS: 100.0000 mg | ORAL_TABLET | Freq: Every day | ORAL | 1 refills | Status: DC

## 2020-01-27 ENCOUNTER — Ambulatory Visit: Payer: Commercial Managed Care - PPO | Admitting: Obstetrics and Gynecology

## 2020-02-16 ENCOUNTER — Ambulatory Visit: Payer: Medicaid Other | Admitting: Obstetrics and Gynecology

## 2020-03-01 ENCOUNTER — Ambulatory Visit: Payer: Medicaid Other | Admitting: Obstetrics and Gynecology

## 2020-03-17 NOTE — Progress Notes (Deleted)
Gynecology Annual Exam  PCP: Lorre Munroe, NP  Chief Complaint: No chief complaint on file.   History of Present Illness: Patient is a 49 y.o. G4P4 presents for annual exam. The patient has no complaints today.   LMP: No LMP recorded. Average Interval: {Desc; regular/irreg:14544}, {numbers 22-35:14824} days Duration of flow: {numbers; 0-10:33138} days Heavy Menses: {yes/no:63} Clots: {yes/no:63} Intermenstrual Bleeding: {yes/no:63} Postcoital Bleeding: {yes/no:63} Dysmenorrhea: {yes/no:63}   The patient {sys sexually active:13135} sexually active. She currently uses {method:5051} for contraception. She {has/denies:315300} dyspareunia.  The patient {DOES_DOES RSW:54627} perform self breast exams.  There {is/is no:19420} notable family history of breast or ovarian cancer in her family.  The patient wears seatbelts: {yes/no:63}.   The patient has regular exercise: {yes/no/not asked:9010}.    The patient {Blank single:19197::"reports","denies"} current symptoms of depression.    Review of Systems: ROS  Past Medical History:  Patient Active Problem List   Diagnosis Date Noted  . Depression, recurrent ([facility]) 03/28/2018    Past Surgical History:  Past Surgical History:  Procedure Laterality Date  . BREAST ENHANCEMENT SURGERY    . CESAREAN SECTION    . COLPOSCOPY    . LABIOPLASTY      Gynecologic History:  No LMP recorded. Contraception: {method:5051}  Mammogram: 11/11/2018 BI-RAD II Last Pap: Results were: 01/20/2019 CIN I at 12 O'Clock negative ECC 11/25/2018 NIL HPV positive 12/21/2017 Colposcopy negative 12/03/2018ASCUS with POSITIVE high risk HPV 08/25/2016 Colposcopy CIN I 03/14/2016 LSIL HR HPV positive 11/16/2015 LSIL HR HPV positive     Obstetric History: G4P4  Family History:  Family History  Problem Relation Age of Onset  . Diabetes Mother   . Hyperlipidemia Mother   . COPD Father   . Depression Daughter    Social History:  Social  History   Socioeconomic History  . Marital status: Divorced    Spouse name: Not on file  . Number of children: Not on file  . Years of education: Not on file  . Highest education level: Not on file  Occupational History  . Not on file  Tobacco Use  . Smoking status: Never Smoker  . Smokeless tobacco: Never Used  Substance and Sexual Activity  . Alcohol use: Yes    Comment: occasional  . Drug use: No  . Sexual activity: Yes  Other Topics Concern  . Not on file  Social History Narrative  . Not on file   Social Determinants of Health   Financial Resource Strain:   . Difficulty of Paying Living Expenses:   Food Insecurity:   . Worried About Programme researcher, broadcasting/film/video in the Last Year:   . Barista in the Last Year:   Transportation Needs:   . Freight forwarder (Medical):   Marland Kitchen Lack of Transportation (Non-Medical):   Physical Activity:   . Days of Exercise per Week:   . Minutes of Exercise per Session:   Stress:   . Feeling of Stress :   Social Connections:   . Frequency of Communication with Friends and Family:   . Frequency of Social Gatherings with Friends and Family:   . Attends Religious Services:   . Active Member of Clubs or Organizations:   . Attends Banker Meetings:   Marland Kitchen Marital Status:   Intimate Partner Violence:   . Fear of Current or Ex-Partner:   . Emotionally Abused:   Marland Kitchen Physically Abused:   . Sexually Abused:     Allergies:  Allergies  Allergen Reactions  .  Penicillins Rash    Medications: Prior to Admission medications   Medication Sig Start Date End Date Taking? Authorizing Provider  ciprofloxacin (CIPRO) 500 MG tablet Take 1 tablet (500 mg total) by mouth 2 (two) times daily. 07/31/19   Jearld Fenton, NP  sertraline (ZOLOFT) 100 MG tablet Take 1 tablet (100 mg total) by mouth daily. 01/01/20   Jearld Fenton, NP    Physical Exam Vitals: ***  General: NAD HEENT: normocephalic, anicteric Thyroid: no enlargement, no  palpable nodules Pulmonary: No increased work of breathing, CTAB Cardiovascular: RRR, distal pulses 2+ Breast: Breast symmetrical, no tenderness, no palpable nodules or masses, no skin or nipple retraction present, no nipple discharge.  No axillary or supraclavicular lymphadenopathy. Abdomen: NABS, soft, non-tender, non-distended.  Umbilicus without lesions.  No hepatomegaly, splenomegaly or masses palpable. No evidence of hernia  Genitourinary:  External: Normal external female genitalia.  Normal urethral meatus, normal Bartholin's and Skene's glands.    Vagina: Normal vaginal mucosa, no evidence of prolapse.    Cervix: Grossly normal in appearance, no bleeding  Uterus: Non-enlarged, mobile, normal contour.  No CMT  Adnexa: ovaries non-enlarged, no adnexal masses  Rectal: deferred  Lymphatic: no evidence of inguinal lymphadenopathy Extremities: no edema, erythema, or tenderness Neurologic: Grossly intact Psychiatric: mood appropriate, affect full  Female chaperone present for pelvic and breast  portions of the physical exam    Assessment: 49 y.o. G4P4 routine annual exam  Plan: Problem List Items Addressed This Visit    None      1) Mammogram - recommend yearly screening mammogram.  Mammogram Was ordered today   2) STI screening  was notoffered and therefore not obtained  3) ASCCP guidelines and rational discussed. Obtained today to follow up on abnormal and CIN I colposcopy last year  4) Contraception - the patient is currently using  none.  She is happy with her current form of contraception and plans to continue  5) Colonoscopy -- Screening recommended starting at age 42 for average risk individuals, age 80 for individuals deemed at increased risk (including African Americans) and recommended to continue until age 3.  For patient age 16-85 individualized approach is recommended.  Gold standard screening is via colonoscopy, Cologuard screening is an acceptable alternative  for patient unwilling or unable to undergo colonoscopy.  "Colorectal cancer screening for average?risk adults: 2018 guideline update from the American Cancer Society"CA: A Cancer Journal for Clinicians: May 16, 2017   6) Routine healthcare maintenance including cholesterol, diabetes screening discussed managed by PCP  7) No follow-ups on file.   Malachy Mood, MD, [facility], [facility] 03/17/2020, 5:41 PM

## 2020-03-18 ENCOUNTER — Ambulatory Visit: Payer: Medicaid Other | Admitting: Obstetrics and Gynecology

## 2020-04-09 ENCOUNTER — Encounter: Payer: Self-pay | Admitting: Obstetrics and Gynecology

## 2020-04-09 ENCOUNTER — Other Ambulatory Visit: Payer: Self-pay

## 2020-04-09 ENCOUNTER — Ambulatory Visit (INDEPENDENT_AMBULATORY_CARE_PROVIDER_SITE_OTHER): Payer: Medicaid Other | Admitting: Obstetrics and Gynecology

## 2020-04-09 ENCOUNTER — Other Ambulatory Visit (HOSPITAL_COMMUNITY)
Admission: RE | Admit: 2020-04-09 | Discharge: 2020-04-09 | Disposition: A | Discharge status: Home / Self Care | Payer: Medicaid Other | Source: Ambulatory Visit | Attending: Obstetrics and Gynecology | Admitting: Obstetrics and Gynecology

## 2020-04-09 VITALS — BP 116/72 | HR 88 | Ht 65.0 in | Wt 201.0 lb

## 2020-04-09 DIAGNOSIS — Z124 Encounter for screening for malignant neoplasm of cervix: Secondary | ICD-10-CM | POA: Insufficient documentation

## 2020-04-09 DIAGNOSIS — Z1239 Encounter for other screening for malignant neoplasm of breast: Secondary | ICD-10-CM

## 2020-04-09 DIAGNOSIS — Z01419 Encounter for gynecological examination (general) (routine) without abnormal findings: Secondary | ICD-10-CM

## 2020-04-09 DIAGNOSIS — Z0000 Encounter for general adult medical examination without abnormal findings: Secondary | ICD-10-CM | POA: Diagnosis not present

## 2020-04-09 NOTE — Patient Instructions (Signed)
[facility] [address] [place] [address]  [facility] [place]  [address] [place] [address]  Phone: [phone]  

## 2020-04-09 NOTE — Progress Notes (Signed)
Gynecology Annual Exam  PCP: Melinda Roach, No Pcp Per  Chief Complaint:  Chief Complaint  Melinda Roach presents with  . Gynecologic Exam    Discuss re-starting birthcontrol    History of Present Illness: Melinda Roach is a 49 y.o. G4P4 presents for annual exam. The Melinda Roach has no complaints today.   LMP: Melinda Roach's last menstrual period was 03/19/2020. Regular monthly, no menstrual complaints   The Melinda Roach does perform self breast exams.  There is no notable family history of breast or ovarian cancer in her family.  The Melinda Roach wears seatbelts: yes.   The Melinda Roach has regular exercise: not asked.    The Melinda Roach denies current symptoms of depression.    Review of Systems: Review of Systems  Constitutional: Negative for chills and fever.  HENT: Negative for congestion.   Respiratory: Negative for cough and shortness of breath.   Cardiovascular: Negative for chest pain and palpitations.  Gastrointestinal: Negative for abdominal pain, constipation, diarrhea, heartburn, nausea and vomiting.  Genitourinary: Negative for dysuria, frequency and urgency.  Skin: Negative for itching and rash.  Neurological: Negative for dizziness and headaches.  Endo/Heme/Allergies: Negative for polydipsia.  Psychiatric/Behavioral: Negative for depression.    Past Medical History:  Melinda Roach Active Problem List   Diagnosis Date Noted  . Depression, recurrent ([facility]) 03/28/2018    Past Surgical History:  Past Surgical History:  Procedure Laterality Date  . BREAST ENHANCEMENT SURGERY    . CESAREAN SECTION    . COLPOSCOPY    . LABIOPLASTY      Gynecologic History:  Melinda Roach's last menstrual period was 03/19/2020. Contraception: 01/20/2019 Colposcopy CIN I 12 O'Clock and benign ECC 12/15/2018 NIL HPV positive, 16/18 negative 12/31/2017 Colposcopy 12 O'clock benign, ECC benign 11/19/2017 ASCUS HPV positive 11/16/2015 LGSIL HPV positive  Last mammogram: 11/11/2018 BI-RAD 2 [facility]  Obstetric History:  G4P4  Family History:  Family History  Problem Relation Age of Onset  . Diabetes Mother   . Hyperlipidemia Mother   . COPD Father   . Depression Daughter     Social History:  Social History   Socioeconomic History  . Marital status: Divorced    Spouse name: Not on file  . Number of children: Not on file  . Years of education: Not on file  . Highest education level: Not on file  Occupational History  . Not on file  Tobacco Use  . Smoking status: Never Smoker  . Smokeless tobacco: Never Used  Substance and Sexual Activity  . Alcohol use: Yes    Comment: occasional  . Drug use: No  . Sexual activity: Yes  Other Topics Concern  . Not on file  Social History Narrative  . Not on file   Social Determinants of Health   Financial Resource Strain:   . Difficulty of Paying Living Expenses:   Food Insecurity:   . Worried About Charity fundraiser in the Last Year:   . Arboriculturist in the Last Year:   Transportation Needs:   . Film/video editor (Medical):   Marland Kitchen Lack of Transportation (Non-Medical):   Physical Activity:   . Days of Exercise per Week:   . Minutes of Exercise per Session:   Stress:   . Feeling of Stress :   Social Connections:   . Frequency of Communication with Friends and Family:   . Frequency of Social Gatherings with Friends and Family:   . Attends Religious Services:   . Active Member of Clubs or Organizations:   .  Attends Banker Meetings:   Marland Kitchen Marital Status:   Intimate Partner Violence:   . Fear of Current or Ex-Partner:   . Emotionally Abused:   Marland Kitchen Physically Abused:   . Sexually Abused:     Allergies:  Allergies  Allergen Reactions  . Penicillins Rash    Medications: Prior to Admission medications   Medication Sig Start Date End Date Taking? Authorizing Provider  ciprofloxacin (CIPRO) 500 MG tablet Take 1 tablet (500 mg total) by mouth 2 (two) times daily. 07/31/19   Lorre Munroe, NP  sertraline (ZOLOFT) 100 MG  tablet Take 1 tablet (100 mg total) by mouth daily. 01/01/20   Lorre Munroe, NP    Physical Exam Vitals: Blood pressure 116/72, pulse 88, height 5\' 5"  (1.651 m), weight 201 lb (91.2 kg), last menstrual period 03/19/2020.   General: NAD HEENT: normocephalic, anicteric Thyroid: no enlargement, no palpable nodules Pulmonary: No increased work of breathing, CTAB Cardiovascular: RRR, distal pulses 2+ Breast: Breast symmetrical, no tenderness, no palpable nodules or masses, no skin or nipple retraction present, no nipple discharge.  No axillary or supraclavicular lymphadenopathy. Abdomen: NABS, soft, non-tender, non-distended.  Umbilicus without lesions.  No hepatomegaly, splenomegaly or masses palpable. No evidence of hernia  Genitourinary:  External: Normal external female genitalia.  Normal urethral meatus, normal Bartholin's and Skene's glands.    Vagina: Normal vaginal mucosa, no evidence of prolapse.    Cervix: Grossly normal in appearance, no bleeding  Uterus: Non-enlarged, mobile, normal contour.  No CMT  Adnexa: ovaries non-enlarged, no adnexal masses  Rectal: deferred  Lymphatic: no evidence of inguinal lymphadenopathy Extremities: no edema, erythema, or tenderness Neurologic: Grossly intact Psychiatric: mood appropriate, affect full  Female chaperone present for pelvic and breast  portions of the physical exam    Assessment: 49 y.o. G4P4 routine annual exam  Plan: Problem List Items Addressed This Visit    None    Visit Diagnoses    Encounter for gynecological examination without abnormal finding    -  Primary   Screening for malignant neoplasm of cervix       Relevant Orders   Cytology - PAP   Breast screening       Relevant Orders   MM 3D SCREEN BREAST BILATERAL      1) Mammogram - recommend yearly screening mammogram.  Mammogram Was ordered today   2) STI screening  was notoffered and therefore not obtained  3) ASCCP guidelines and rational discussed.   Melinda Roach opts for every 3 years screening interval  5) Colonoscopy -- due at age 20  6) Routine healthcare maintenance including cholesterol, diabetes screening discussed managed by PCP  7) No follow-ups on file.   53  11, MD, [facility], [facility] 04/09/2020, 1:37 PM

## 2020-04-13 LAB — CYTOLOGY - PAP
Comment: NEGATIVE
High risk HPV: POSITIVE — AB

## 2020-04-16 NOTE — Progress Notes (Signed)
In office LEEP in the next 2-6 weeks

## 2020-04-19 ENCOUNTER — Telehealth: Payer: Self-pay | Admitting: Obstetrics and Gynecology

## 2020-04-19 NOTE — Telephone Encounter (Signed)
-----   Message from Vena Austria, MD sent at 04/16/2020  6:09 PM EDT ----- In office LEEP in the next 2-6 weeks

## 2020-04-26 ENCOUNTER — Telehealth: Payer: Medicaid Other | Admitting: Emergency Medicine

## 2020-04-26 DIAGNOSIS — U071 COVID-19: Secondary | ICD-10-CM

## 2020-04-26 MED ORDER — ALBUTEROL SULFATE HFA 108 (90 BASE) MCG/ACT IN AERS: 2.0000 | INHALATION_SPRAY | RESPIRATORY_TRACT | 0 refills | Status: DC | PRN

## 2020-04-26 NOTE — Progress Notes (Signed)
We are sorry you are not feeling well. We are here to help!  You have tested positive for COVID-19, meaning that you were infected with the novel coronavirus and could give the germ to others.    I recommend getting an inhaler.  I've sent one to the pharmacy.  Continue to monitor your O2 saturation.  If it drops below 90% at rest or while walking, you should go to the ER.  You have been enrolled in [place] for COVID-19. Daily you will receive a questionnaire within the [place] website. Our COVID-19 response team will be monitoring your responses daily.  Please continue isolation at home, for at least 10 days since the start of your symptoms and until you have had 24 hours with no fever (without taking a fever reducer) and with improving of symptoms.  Please continue good preventive care measures, including:  frequent hand-washing, avoid touching your face, cover coughs/sneezes, stay out of crowds and keep a 6 foot distance from others.  Follow up with your provider or go to the nearest hospital ED for re-assessment if fever/cough/breathlessness return.  The following symptoms may appear 2-14 days after exposure: . Fever . Cough . Shortness of breath or difficulty breathing . Chills . Repeated shaking with chills . Muscle pain . Headache . Sore throat . New loss of taste or smell . Fatigue . Congestion or runny nose . Nausea or vomiting . Diarrhea  Go to the nearest hospital ED for assessment if fever/cough/breathlessness are severe or illness seems like a threat to life.  It is vitally important that if you feel that you have an infection such as this virus or any other virus that you stay home and away from places where you may spread it to others.  You should avoid contact with people age 18 and older.   You can use medication such as A prescription inhaler called Albuterol MDI 90 mcg /actuation 2 puffs every 4 hours as needed for shortness of breath, wheezing,  cough  You may also take acetaminophen (Tylenol) as needed for fever.  Reduce your risk of any infection by using the same precautions used for avoiding the common cold or flu:  Marland Kitchen Wash your hands often with soap and warm water for at least 20 seconds.  If soap and water are not readily available, use an alcohol-based hand sanitizer with at least 60% alcohol.  . If coughing or sneezing, cover your mouth and nose by coughing or sneezing into the elbow areas of your shirt or coat, into a tissue or into your sleeve (not your hands). . Avoid shaking hands with others and consider head nods or verbal greetings only. . Avoid touching your eyes, nose, or mouth with unwashed hands.  . Avoid close contact with people who are sick. . Avoid places or events with large numbers of people in one location, like concerts or sporting events. . Carefully consider travel plans you have or are making. . If you are planning any travel outside or inside the [place], visit the CDC's Travelers' Health webpage for the latest health notices. . If you have some symptoms but not all symptoms, continue to monitor at home and seek medical attention if your symptoms worsen. . If you are having a medical emergency, call 911.  HOME CARE . Only take medications as instructed by your medical team. . Drink plenty of fluids and get plenty of rest. . A steam or ultrasonic humidifier can help if you have congestion.  GET HELP RIGHT AWAY IF YOU HAVE EMERGENCY WARNING SIGNS** FOR COVID-19. If you or someone is showing any of these signs seek emergency medical care immediately. Call 911 or proceed to your closest emergency facility if: . You develop worsening high fever. . Trouble breathing . Bluish lips or face . Persistent pain or pressure in the chest . New confusion . Inability to wake or stay awake . You cough up blood. . Your symptoms become more severe  **This list is not all possible symptoms. Contact your medical provider  for any symptoms that are sever or concerning to you.  MAKE SURE YOU   Understand these instructions.  Will watch your condition.  Will get help right away if you are not doing well or get worse.  Your e-visit answers were reviewed by a board certified advanced clinical practitioner to complete your personal care plan.  Depending on the condition, your plan could have included both over the counter or prescription medications.  If there is a problem please reply once you have received a response from your provider.  Your safety is important to [place].  If you have drug allergies check your prescription carefully.    You can use MyChart to ask questions about today's visit, request a non-urgent call back, or ask for a work or school excuse for 24 hours related to this e-Visit. If it has been greater than 24 hours you will need to follow up with your provider, or enter a new e-Visit to address those concerns. You will get an e-mail in the next two days asking about your experience.  I hope that your e-visit has been valuable and will speed your recovery. Thank you for using e-visits.      Approximately 5 minutes was used in reviewing the patient's chart, questionnaire, prescribing medications, and documentation.

## 2020-05-03 DIAGNOSIS — Z03818 Encounter for observation for suspected exposure to other biological agents ruled out: Secondary | ICD-10-CM | POA: Diagnosis not present

## 2020-05-03 DIAGNOSIS — Z20828 Contact with and (suspected) exposure to other viral communicable diseases: Secondary | ICD-10-CM | POA: Diagnosis not present

## 2020-05-13 ENCOUNTER — Telehealth: Payer: Self-pay | Admitting: Obstetrics and Gynecology

## 2020-05-14 ENCOUNTER — Other Ambulatory Visit (HOSPITAL_COMMUNITY)
Admission: RE | Admit: 2020-05-14 | Discharge: 2020-05-14 | Disposition: A | Discharge status: Home / Self Care | Payer: Medicaid Other | Source: Ambulatory Visit | Attending: Obstetrics and Gynecology | Admitting: Obstetrics and Gynecology

## 2020-05-14 ENCOUNTER — Encounter: Payer: Self-pay | Admitting: Obstetrics and Gynecology

## 2020-05-14 ENCOUNTER — Other Ambulatory Visit: Payer: Self-pay

## 2020-05-14 ENCOUNTER — Ambulatory Visit (INDEPENDENT_AMBULATORY_CARE_PROVIDER_SITE_OTHER): Payer: Medicaid Other | Admitting: Obstetrics and Gynecology

## 2020-05-14 VITALS — BP 118/78 | HR 78 | Ht 65.5 in | Wt 198.0 lb

## 2020-05-14 DIAGNOSIS — R87612 Low grade squamous intraepithelial lesion on cytologic smear of cervix (LGSIL): Secondary | ICD-10-CM | POA: Diagnosis not present

## 2020-05-14 DIAGNOSIS — N72 Inflammatory disease of cervix uteri: Secondary | ICD-10-CM | POA: Insufficient documentation

## 2020-05-14 DIAGNOSIS — B977 Papillomavirus as the cause of diseases classified elsewhere: Secondary | ICD-10-CM

## 2020-05-14 DIAGNOSIS — N870 Mild cervical dysplasia: Secondary | ICD-10-CM | POA: Diagnosis not present

## 2020-05-14 DIAGNOSIS — N879 Dysplasia of cervix uteri, unspecified: Secondary | ICD-10-CM | POA: Insufficient documentation

## 2020-05-14 MED ORDER — HYDROCODONE-ACETAMINOPHEN 5-325 MG PO TABS: 1.0000 | ORAL_TABLET | Freq: Four times a day (QID) | ORAL | 0 refills | Status: DC | PRN

## 2020-05-14 NOTE — Progress Notes (Signed)
   LEEP PROCEDURE NOTE  The LEEP has been explained to the patient in detail; risks/benefits reviewed.  The risks include, but are not limited to, bleeding, infection, and the possibility of cervical stenosis or cervical incompetence.  The patient had previously been given information regarding abnormal PAP smears and their relationship to HPV.  We have discussed the natural course and history of HPV, the possibility of incomplete treatment by the LEEP, as well as the possibility of recurrence.  I have reviewed the consent form for LEEP with her, and she fully understands its contents.  We have discussed the procedure itself. I have informed her that following the LEEP she should refrain from intercourse and the use of tampons for three weeks, and that she should also expect some spotting and brown/black discharge over the next several days.  We have discussed the fact that vaginal bleeding, differentiated from spotting, is not normal and that if she should have this complication, she should contact me immediately.  The follow-up after LEEP will be PAP smears or viral typing performed at regular intervals for up to 3-5 years.  Should these all prove to be normal, she will then be back on typical cervical screening.  I have answered all of her questions, and I believe she has an adequate understanding of the LEEP, its implications, and the necessity of follow-up care.  I discussed her colpo results and explained the procedure of LEEP.  Given persistence we discussed repeat colposcopy vs proceed with LEEP for treatment with patient opting to proceed with LEEP. All questions were answered and she signed the consent form.    Prior Paps  04/09/2020 LGSIL HPV positive 01/20/2019 Colposcopy CIN I 12 O'Clock and benign ECC 12/15/2018 NIL HPV positive, 16/18 negative 12/31/2017 Colposcopy 12 O'clock benign, ECC benign 11/19/2017 ASCUS HPV positive 11/16/2015 LGSIL HPV positive      LEEP performed in the usual  manner after reviewing the previous colpo findings and results. Acetic acid solution was usesd to identify any abnormal areas of the cervix.  The cervix was cleansed with betadine solution. Local injection of 21mL of lidocaine with epinephrine was performed for anesthesia. Ectocervical and then endocervical specimens obtained using the loop electrodes without difficulty.  It was labeled accordingly. The base and edges of the defect was then cauterized using coagulation current.  Vena Austria, MD 05/14/2020 2:24 PM

## 2020-05-14 NOTE — Telephone Encounter (Signed)
error 

## 2020-05-20 LAB — SURGICAL PATHOLOGY

## 2020-06-10 ENCOUNTER — Encounter: Payer: Self-pay | Admitting: Obstetrics and Gynecology

## 2020-06-10 DIAGNOSIS — Z1231 Encounter for screening mammogram for malignant neoplasm of breast: Secondary | ICD-10-CM | POA: Diagnosis not present

## 2020-06-28 ENCOUNTER — Other Ambulatory Visit: Payer: Self-pay

## 2020-06-28 ENCOUNTER — Encounter: Payer: Self-pay | Admitting: Obstetrics and Gynecology

## 2020-06-28 ENCOUNTER — Ambulatory Visit (INDEPENDENT_AMBULATORY_CARE_PROVIDER_SITE_OTHER): Payer: Medicaid Other | Admitting: Obstetrics and Gynecology

## 2020-06-28 VITALS — BP 104/72 | Wt 202.0 lb

## 2020-06-28 DIAGNOSIS — Z4889 Encounter for other specified surgical aftercare: Secondary | ICD-10-CM

## 2020-06-28 DIAGNOSIS — Z30011 Encounter for initial prescription of contraceptive pills: Secondary | ICD-10-CM

## 2020-06-28 MED ORDER — SLYND 4 MG PO TABS: 1.0000 | ORAL_TABLET | Freq: Every day | ORAL | 3 refills | Status: DC

## 2020-06-28 NOTE — Progress Notes (Signed)
Postoperative Follow-up Patient presents post op from LEEP 6weeks ago for abnormal pap.  Subjective: Patient reports some improvement in her preop symptoms. Eating a regular diet without difficulty. The patient is not having any pain.  Activity: normal activities of daily living.  Objective: Blood pressure 104/72, weight 202 lb (91.6 kg), last menstrual period 06/19/2020.  General: NAD Pulmonary: no increased work of breathing GU: normal external female genitalia cervix well healed Extremities: no edema Neurologic: normal gait    Procedure visit on 05/14/2020  Component Date Value Ref Range Status  . SURGICAL PATHOLOGY 05/14/2020    Final-Edited                   Value:SURGICAL PATHOLOGY CASE: [id number] PATIENT: [facility] Path[facility] Report     Clinical History: persistently abnormal paps, LGSIL, high risk HPV  (cm)    FINAL MICROSCOPIC DIAGNOSIS:  A. ECTOCERVIX, LEEP: -  Low grade squamous intraepithelial lesion (CIN1, mild dysplasia) -  Margins uninvolved by dysplasia  B. ENDOCERVIX, LEEP: -  Low grade squamous intraepithelial lesion (CIN1, mild dysplasia) -  Margins uninvolved by dysplasia  C. ENDOCERVIX, CURETTAGE: -  Detached, benign endocervical glandular epithelium -  No malignancy identified   COMMENT:  A-C.  Dr. Kenard Gower reviewed the case and agrees with the above diagnosis.  GROSS DESCRIPTION:  A.  Received in formalin is an unoriented 1.5 x 1.2 x 0.2 cm excision of tan-gray glistening mucosa, with a 0.5 cm central slitlike os. Ectocervical soft tissue margin inked black, endocervical edge inked yellow.  Specimen sectioned and submitted entirely in 3 cassettes.  B.  Received in formalin a                         re several fragmented excisions of roughened tan tissue ranging from 0.5 to 1 cm in greatest dimension with adherent mucoid material.  An os is not identified.  Specimen sectioned and submitted entirely in 2  cassettes.  C.  Received in formalin is blood tinged mucus that is entirely submitted in one block.  Volume: 1.7 x 1.5 x 0.3 cm (1 B) (AK 05/18/2020)   Final Diagnosis performed by Manning Charity, MD.   Electronically signed 05/20/2020 Technical and / or Professional components performed at [facility]. [facility], [address] [place], [place] [address].  Immunohistochemistry Technical component (if applicable) was performed at [facility]. [address], [address], [place], [place] [address].   IMMUNOHISTOCHEMISTRY DISCLAIMER (if applicable): Some of these immunohistochemical stains may have been developed and the performance characteristics determine by [facility]. Some may not have been cleared or approved by the U.S. Food and                          Drug Administration. The FDA has determined that such clearance or approval is not necessary. This test is used for clinical purposes. It should not be regarded as investigational or for research. This laboratory is certified under the Clinical Laboratory Improvement Amendments of 1988 (CLIA-88) as qualified to perform high complexity clinical laboratory testing.  The controls stained appropriately.     Assessment: 49 y.o. s/p LEEP stable  Plan: Patient has done well after surgery with no apparent complications.  I have discussed the post-operative course to date, and the expected progress moving forward.  The patient understands what complications to be concerned about.  I will  see the patient in routine follow up, or sooner if needed.    Activity plan: No restriction.  Rx s[facility]nd [facility]  Vena Austria, MD, [name], [facility] 06/28/2020, 2:11 PM

## 2020-07-03 ENCOUNTER — Other Ambulatory Visit: Payer: Self-pay | Admitting: Internal Medicine

## 2020-07-19 DIAGNOSIS — Z20822 Contact with and (suspected) exposure to covid-19: Secondary | ICD-10-CM | POA: Diagnosis not present

## 2020-10-14 ENCOUNTER — Other Ambulatory Visit: Payer: Self-pay | Admitting: Internal Medicine

## 2020-11-06 ENCOUNTER — Other Ambulatory Visit: Payer: Self-pay | Admitting: Internal Medicine

## 2021-01-03 ENCOUNTER — Other Ambulatory Visit: Payer: Medicaid Other

## 2021-01-06 ENCOUNTER — Other Ambulatory Visit: Payer: Self-pay

## 2021-01-10 ENCOUNTER — Ambulatory Visit (INDEPENDENT_AMBULATORY_CARE_PROVIDER_SITE_OTHER): Payer: Medicaid Other | Admitting: Internal Medicine

## 2021-01-10 ENCOUNTER — Other Ambulatory Visit: Payer: Self-pay

## 2021-01-10 ENCOUNTER — Encounter: Payer: Self-pay | Admitting: Internal Medicine

## 2021-01-10 VITALS — BP 122/80 | HR 79 | Temp 98.1°F | Ht 64.75 in | Wt 208.0 lb

## 2021-01-10 DIAGNOSIS — Z114 Encounter for screening for human immunodeficiency virus [HIV]: Secondary | ICD-10-CM | POA: Diagnosis not present

## 2021-01-10 DIAGNOSIS — F339 Major depressive disorder, recurrent, unspecified: Secondary | ICD-10-CM | POA: Diagnosis not present

## 2021-01-10 DIAGNOSIS — Z0001 Encounter for general adult medical examination with abnormal findings: Secondary | ICD-10-CM

## 2021-01-10 DIAGNOSIS — Z1159 Encounter for screening for other viral diseases: Secondary | ICD-10-CM | POA: Diagnosis not present

## 2021-01-10 LAB — LIPID PANEL
Cholesterol: 198 mg/dL (ref 0–200)
HDL: 36.2 mg/dL — ABNORMAL LOW (ref >39.00)
LDL Cholesterol: 133 mg/dL — ABNORMAL HIGH (ref 0–99)
NonHDL: 161.48
Total CHOL/HDL Ratio: 5
Triglycerides: 144 mg/dL (ref 0.0–149.0)
VLDL: 28.8 mg/dL (ref 0.0–40.0)

## 2021-01-10 LAB — COMPREHENSIVE METABOLIC PANEL
ALT: 13 U/L (ref 0–35)
AST: 13 U/L (ref 0–37)
Albumin: 4.7 g/dL (ref 3.5–5.2)
Alkaline Phosphatase: 55 U/L (ref 39–117)
BUN: 18 mg/dL (ref 6–23)
CO2: 26 mEq/L (ref 19–32)
Calcium: 9.5 mg/dL (ref 8.4–10.5)
Chloride: 104 mEq/L (ref 96–112)
Creatinine, Ser: 0.89 mg/dL (ref 0.40–1.20)
GFR: 76.43 mL/min (ref >60.00)
Glucose, Bld: 146 mg/dL — ABNORMAL HIGH (ref 70–99)
Potassium: 4.1 mEq/L (ref 3.5–5.1)
Sodium: 138 mEq/L (ref 135–145)
Total Bilirubin: 0.6 mg/dL (ref 0.2–1.2)
Total Protein: 7.5 g/dL (ref 6.0–8.3)

## 2021-01-10 LAB — CBC
HCT: 41.5 % (ref 36.0–46.0)
Hemoglobin: 14.2 g/dL (ref 12.0–15.0)
MCHC: 34.3 g/dL (ref 30.0–36.0)
MCV: 90.8 fl (ref 78.0–100.0)
Platelets: 273 10*3/uL (ref 150.0–400.0)
RBC: 4.57 Mil/uL (ref 3.87–5.11)
RDW: 14.8 % (ref 11.5–15.5)
WBC: 7.8 10*3/uL (ref 4.0–10.5)

## 2021-01-10 LAB — HEMOGLOBIN A1C: Hgb A1c MFr Bld: 6 % (ref 4.6–6.5)

## 2021-01-10 MED ORDER — SERTRALINE HCL 100 MG PO TABS: 100.0000 mg | ORAL_TABLET | Freq: Every day | ORAL | 3 refills | Status: DC

## 2021-01-10 NOTE — Patient Instructions (Signed)

## 2021-01-10 NOTE — Progress Notes (Signed)
Subjective:    Patient ID: Melinda Roach, female    DOB: 12/09/1971, 50 y.o.   MRN: [id number]  HPI  Pt presents the clinic today for her annual exam.    Depression: Managed on Sertraline.  She is not currently seeing a therapist.  She denies anxiety, SI/HI.  Flu: never Tetanus: 02/2014 Covid: Pfizer x 2 Pap smear: 03/2020 Mammogram: 05/2020 Colon screen: never Vision screening: annually Dentist: biannually  Diet: She does eat meat. She consume some fruits and veggies. She tries to avoid fried foods. She drinks mostly water, dt soda. Exercise: Walking   Review of Systems      Past Medical History:  Diagnosis Date  . Depression   . History of chicken pox     Current Outpatient Medications  Medication Sig Dispense Refill  . Drospirenone (SLYND) 4 MG TABS Take 1 tablet by mouth daily. 84 tablet 3  . sertraline (ZOLOFT) 100 MG tablet Take 1 tablet (100 mg total) by mouth daily. 90 tablet 0   No current facility-administered medications for this visit.    Allergies  Allergen Reactions  . Penicillins Rash    Family History  Problem Relation Age of Onset  . Diabetes Mother   . Hyperlipidemia Mother   . COPD Father   . Depression Daughter     Social History   Socioeconomic History  . Marital status: Divorced    Spouse name: Not on file  . Number of children: Not on file  . Years of education: Not on file  . Highest education level: Not on file  Occupational History  . Not on file  Tobacco Use  . Smoking status: Never Smoker  . Smokeless tobacco: Never Used  Vaping Use  . Vaping Use: Never used  Substance and Sexual Activity  . Alcohol use: Yes    Comment: occasional  . Drug use: No  . Sexual activity: Yes    Birth control/protection: None  Other Topics Concern  . Not on file  Social History Narrative  . Not on file   Social Determinants of Health   Financial Resource Strain: Not on file  Food Insecurity: Not on file  Transportation Needs: Not  on file  Physical Activity: Not on file  Stress: Not on file  Social Connections: Not on file  Intimate Partner Violence: Not on file     Constitutional: Denies fever, malaise, fatigue, headache or abrupt weight changes.  HEENT: Denies eye pain, eye redness, ear pain, ringing in the ears, wax buildup, runny nose, nasal congestion, bloody nose, or sore throat. Respiratory: Denies difficulty breathing, shortness of breath, cough or sputum production.   Cardiovascular: Denies chest pain, chest tightness, palpitations or swelling in the hands or feet.  Gastrointestinal: Denies abdominal pain, bloating, constipation, diarrhea or blood in the stool.  GU: Denies urgency, frequency, pain with urination, burning sensation, blood in urine, odor or discharge. Musculoskeletal: Denies decrease in range of motion, difficulty with gait, muscle pain or joint pain and swelling.  Skin: Denies redness, rashes, lesions or ulcercations.  Neurological: Denies dizziness, difficulty with memory, difficulty with speech or problems with balance and coordination.  Psych: Pt has a history of depression. Denies anxiety, SI/HI.  No other specific complaints in a complete review of systems (except as listed in HPI above).  Objective:   Physical Exam   BP 122/80   Pulse 79   Temp 98.1 F (36.7 C) (Temporal)   Ht 5' 4.75" (1.645 m)   Wt 208 lb (  94.3 kg)   SpO2 98%   BMI 34.88 kg/m   Wt Readings from Last 3 Encounters:  06/28/20 202 lb (91.6 kg)  05/14/20 198 lb (89.8 kg)  04/09/20 201 lb (91.2 kg)    General: Appears her stated age, obese, in NAD. Skin: Warm, dry and intact. No rashes, lesions or ulcerations noted. HEENT: Head: normal shape and size; Eyes: sclera white, no icterus, conjunctiva pink, PERRLA and EOMs intact; Neck:  Neck supple, trachea midline. No masses, lumps or thyromegaly present.  Cardiovascular: Normal rate and rhythm. S1,S2 noted.  No murmur, rubs or gallops noted. No JVD or BLE  edema.  Pulmonary/Chest: Normal effort and positive vesicular breath sounds. No respiratory distress. No wheezes, rales or ronchi noted.  Abdomen: Soft and nontender. Normal bowel sounds. No distention or masses noted. Liver, spleen and kidneys non palpable. Musculoskeletal: Strength 5/5 BUE/BLE. No difficulty with gait.  Neurological: Alert and oriented. Cranial nerves II-XII grossly intact. Coordination normal.  Psychiatric: Mood and affect normal. Behavior is normal. Judgment and thought content normal.    BMET    Component Value Date/Time   NA 138 08/26/2018 0836   K 3.8 08/26/2018 0836   CL 102 08/26/2018 0836   CO2 28 08/26/2018 0836   GLUCOSE 95 08/26/2018 0836   BUN 10 08/26/2018 0836   CREATININE 0.97 08/26/2018 0836   CREATININE 0.68 11/17/2016 1147   CALCIUM 9.4 08/26/2018 0836   GFRNONAA >89 11/17/2016 1147   GFRAA >89 11/17/2016 1147    Lipid Panel     Component Value Date/Time   CHOL 157 08/26/2018 0836   TRIG 99.0 08/26/2018 0836   HDL 36.60 (L) 08/26/2018 0836   CHOLHDL 4 08/26/2018 0836   VLDL 19.8 08/26/2018 0836   LDLCALC 101 (H) 08/26/2018 0836    CBC    Component Value Date/Time   WBC 8.6 08/26/2018 0836   RBC 4.56 08/26/2018 0836   HGB 14.3 08/26/2018 0836   HCT 41.6 08/26/2018 0836   PLT 285.0 08/26/2018 0836   MCV 91.1 08/26/2018 0836   MCH 31.1 11/17/2016 1147   MCHC 34.3 08/26/2018 0836   RDW 13.8 08/26/2018 0836   LYMPHSABS 2,646 11/17/2016 1147   MONOABS 490 11/17/2016 1147   EOSABS 98 11/17/2016 1147   BASOSABS 0 11/17/2016 1147    Hgb A1C No results found for: HGBA1C         Assessment & Plan:   Preventative Health Maintenance:  She declines flu shot today Tetanus UTD Encouraged her to get her Covid booster Pap smear UTD Mammogram UTD She declines referral to GI for screening colonoiscopy Encouraged her to consume a balanced diet and exercise regimen Advised her to see an eye doctor and dentist annually We will  check CBC, C met, lipid, A1c, HIV and hep C today  RTC in 1 year, sooner if needed Webb Silversmith, NP This visit occurred during the SARS-CoV-2 public health emergency.  Safety protocols were in place, including screening questions prior to the visit, additional usage of staff PPE, and extensive cleaning of exam room while observing appropriate contact time as indicated for disinfecting solutions.

## 2021-01-10 NOTE — Assessment & Plan Note (Signed)
Stable on Sertraline, refilled today Support offered 

## 2021-01-11 LAB — HEPATITIS C ANTIBODY
Hepatitis C Ab: NONREACTIVE
SIGNAL TO CUT-OFF: 0.03 (ref <1.00)

## 2021-01-11 LAB — HIV ANTIBODY (ROUTINE TESTING W REFLEX): HIV 1&2 Ab, 4th Generation: NONREACTIVE

## 2021-04-11 ENCOUNTER — Encounter: Payer: Self-pay | Admitting: Obstetrics and Gynecology

## 2021-04-11 ENCOUNTER — Other Ambulatory Visit (HOSPITAL_COMMUNITY)
Admission: RE | Admit: 2021-04-11 | Discharge: 2021-04-11 | Disposition: A | Discharge status: Home / Self Care | Payer: Medicaid Other | Source: Ambulatory Visit | Attending: Obstetrics and Gynecology | Admitting: Obstetrics and Gynecology

## 2021-04-11 ENCOUNTER — Other Ambulatory Visit: Payer: Self-pay

## 2021-04-11 ENCOUNTER — Ambulatory Visit (INDEPENDENT_AMBULATORY_CARE_PROVIDER_SITE_OTHER): Payer: Medicaid Other | Admitting: Obstetrics and Gynecology

## 2021-04-11 VITALS — BP 118/72 | Ht 65.0 in | Wt 212.0 lb

## 2021-04-11 DIAGNOSIS — Z124 Encounter for screening for malignant neoplasm of cervix: Secondary | ICD-10-CM | POA: Insufficient documentation

## 2021-04-11 DIAGNOSIS — Z3041 Encounter for surveillance of contraceptive pills: Secondary | ICD-10-CM

## 2021-04-11 DIAGNOSIS — Z1239 Encounter for other screening for malignant neoplasm of breast: Secondary | ICD-10-CM

## 2021-04-11 DIAGNOSIS — Z01419 Encounter for gynecological examination (general) (routine) without abnormal findings: Secondary | ICD-10-CM | POA: Diagnosis not present

## 2021-04-11 MED ORDER — SLYND 4 MG PO TABS: 1.0000 | ORAL_TABLET | Freq: Every day | ORAL | 3 refills | Status: DC

## 2021-04-11 NOTE — Progress Notes (Signed)
Gynecology Annual Exam  PCP: Lorre Munroe, NP  Chief Complaint:  Chief Complaint  Patient presents with  . Gynecologic Exam    Annual - no concerns. RM 5    History of Present Illness: Patient is a 50 y.o. G4P4 presents for annual exam. The patient has no complaints today.   LMP: No LMP recorded. (Menstrual status: Irregular Periods). Amenorrhea on Slynd. No significant vasomotor symptoms reported.    The patient is sexually active. She currently uses oral progesterone-only contraceptive for contraception. She denies dyspareunia.  The patient does perform self breast exams.  There is no notable family history of breast or ovarian cancer in her family.  The patient wears seatbelts: yes.   The patient has regular exercise: not asked.    The patient denies current symptoms of depression.    Review of Systems: Review of Systems  Constitutional: Negative.  Negative for chills and fever.  HENT: Negative for congestion.   Respiratory: Negative for cough and shortness of breath.   Cardiovascular: Negative for chest pain and palpitations.  Gastrointestinal: Negative.  Negative for abdominal pain, constipation, diarrhea, heartburn, nausea and vomiting.  Genitourinary: Negative.  Negative for dysuria, frequency and urgency.  Skin: Negative for itching and rash.  Neurological: Negative for dizziness and headaches.  Endo/Heme/Allergies: Negative for polydipsia.  Psychiatric/Behavioral: Negative for depression.    Past Medical History:  Patient Active Problem List   Diagnosis Date Noted  . Depression, recurrent (HCC) 03/28/2018    Past Surgical History:  Past Surgical History:  Procedure Laterality Date  . BREAST ENHANCEMENT SURGERY    . CESAREAN SECTION    . COLPOSCOPY    . LABIOPLASTY      Gynecologic History:  No LMP recorded. (Menstrual status: Irregular Periods). Contraception: oral progesterone-only contraceptive Last Pap: Results were:  05/14/2020 LEEP CIN  I 04/09/2020 LGSIL HPV positive  Last mammogram: 06/10/2020 Results were: BI-RAD II  Obstetric History: G4P4  Family History:  Family History  Problem Relation Age of Onset  . Diabetes Mother   . Hyperlipidemia Mother   . COPD Father   . Depression Daughter     Social History:  Social History   Socioeconomic History  . Marital status: Divorced    Spouse name: Not on file  . Number of children: Not on file  . Years of education: Not on file  . Highest education level: Not on file  Occupational History  . Not on file  Tobacco Use  . Smoking status: Never Smoker  . Smokeless tobacco: Never Used  Vaping Use  . Vaping Use: Never used  Substance and Sexual Activity  . Alcohol use: Yes    Comment: occasional  . Drug use: No  . Sexual activity: Yes    Birth control/protection: None  Other Topics Concern  . Not on file  Social History Narrative  . Not on file   Social Determinants of Health   Financial Resource Strain: Not on file  Food Insecurity: Not on file  Transportation Needs: Not on file  Physical Activity: Not on file  Stress: Not on file  Social Connections: Not on file  Intimate Partner Violence: Not on file    Allergies:  Allergies  Allergen Reactions  . Penicillins Rash    Medications: Prior to Admission medications   Medication Sig Start Date End Date Taking? Authorizing Provider  Drospirenone (SLYND) 4 MG TABS Take 1 tablet by mouth daily. 06/28/20  Yes Vena Austria, MD  sertraline (ZOLOFT) 100  MG tablet Take 1 tablet (100 mg total) by mouth daily. 01/10/21  Yes Lorre Munroe, NP    Physical Exam Vitals: Blood pressure 118/72, height 5\' 5"  (1.651 m), weight 212 lb (96.2 kg).  General: NAD HEENT: normocephalic, anicteric Thyroid: no enlargement, no palpable nodules Pulmonary: No increased work of breathing, CTAB Cardiovascular: RRR, distal pulses 2+ Breast: Breast symmetrical, no tenderness, no palpable nodules or masses, no skin or  nipple retraction present, no nipple discharge.  No axillary or supraclavicular lymphadenopathy. Abdomen: NABS, soft, non-tender, non-distended.  Umbilicus without lesions.  No hepatomegaly, splenomegaly or masses palpable. No evidence of hernia  Genitourinary:  External: Normal external female genitalia.  Normal urethral meatus, normal Bartholin's and Skene's glands.    Vagina: Normal vaginal mucosa, no evidence of prolapse.    Cervix: Grossly normal in appearance, no bleeding  Uterus: Non-enlarged, mobile, normal contour.  No CMT  Adnexa: ovaries non-enlarged, no adnexal masses  Rectal: deferred  Lymphatic: no evidence of inguinal lymphadenopathy Extremities: no edema, erythema, or tenderness Neurologic: Grossly intact Psychiatric: mood appropriate, affect full  Female chaperone present for pelvic and breast  portions of the physical exam  GAD 7 : Generalized Anxiety Score 04/11/2021  Nervous, Anxious, on Edge 0  Control/stop worrying 0  Worry too much - different things 0  Trouble relaxing 0  Restless 0  Easily annoyed or irritable 0  Afraid - awful might happen 0  Total GAD 7 Score 0  Anxiety Difficulty Not difficult at all    Flowsheet Row Office Visit from 04/11/2021 in [facility]  PHQ-9 Total Score 0      Assessment: 50 y.o. G4P4 routine annual exam  Plan: Problem List Items Addressed This Visit   None   Visit Diagnoses    Encounter for gynecological examination without abnormal finding    -  Primary   Breast screening       Screening for malignant neoplasm of cervix       Relevant Orders   Cytology - PAP   Oral contraceptive pill surveillance          1) Mammogram - recommend yearly screening mammogram.  Mammogram Is up to date   2) STI screening  was notoffered and therefore not obtained  3) ASCCP guidelines and rational discussed.  Patient opts for every 3 years screening interval - follow up CIN I pap today  4) Contraception - the patient  is currently using  oral progesterone-only contraceptive.  She is happy with her current form of contraception and plans to continue  5) Colonoscopy -- Screening recommended starting at age 16 for average risk individuals, age 29 for individuals deemed at increased risk (including African Americans) and recommended to continue until age 61.  For patient age 35-85 individualized approach is recommended.  Gold standard screening is via colonoscopy, Cologuard screening is an acceptable alternative for patient unwilling or unable to undergo colonoscopy.  "Colorectal cancer screening for average?risk adults: 2018 guideline update from the American Cancer Society"CA: A Cancer Journal for Clinicians: May 16, 2017  - referral to GI  6) Routine healthcare maintenance including cholesterol, diabetes screening discussed managed by PCP  7) Return in about 1 year (around 04/11/2022) for annual.   04/13/2022, MD, Vena Austria OB/[facility], [facility] 04/11/2021, 1:49 PM

## 2021-04-18 LAB — CYTOLOGY - PAP
Comment: NEGATIVE
Comment: NEGATIVE
Diagnosis: UNDETERMINED — AB
HPV 16: NEGATIVE
HPV 18 / 45: NEGATIVE
High risk HPV: POSITIVE — AB

## 2021-05-18 ENCOUNTER — Encounter: Payer: Self-pay | Admitting: Internal Medicine

## 2021-06-12 ENCOUNTER — Encounter: Payer: Self-pay | Admitting: Internal Medicine

## 2021-06-14 ENCOUNTER — Ambulatory Visit: Payer: BLUE CROSS/BLUE SHIELD | Admitting: Internal Medicine

## 2021-06-14 ENCOUNTER — Encounter: Payer: Self-pay | Admitting: Internal Medicine

## 2021-06-14 ENCOUNTER — Other Ambulatory Visit: Payer: Self-pay

## 2021-06-14 ENCOUNTER — Other Ambulatory Visit (HOSPITAL_COMMUNITY)
Admission: RE | Admit: 2021-06-14 | Discharge: 2021-06-14 | Disposition: A | Discharge status: Home / Self Care | Payer: Medicaid Other | Source: Ambulatory Visit | Attending: Internal Medicine | Admitting: Internal Medicine

## 2021-06-14 VITALS — BP 106/57 | HR 94 | Temp 97.3°F | Resp 18 | Ht 65.0 in | Wt 212.6 lb

## 2021-06-14 DIAGNOSIS — Z113 Encounter for screening for infections with a predominantly sexual mode of transmission: Secondary | ICD-10-CM | POA: Diagnosis not present

## 2021-06-14 NOTE — Patient Instructions (Signed)
Preventing Sexually Transmitted Infections, Adult  Sexually transmitted infections (STIs) are diseases that are spread from person to person (are contagious). They are spread, or transmitted, through bodily fluids exchanged during sex or sexual contact. These bodily fluids include saliva, semen, blood, vaginal mucus, and urine. STIs are very common among people of all ages.  Some common STIs include:  Herpes.  Hepatitis B.  Chlamydia.  Gonorrhea.  Syphilis.  HPV (human papillomavirus).  HIV, also called the human immunodeficiency virus. This is the virus that can cause AIDS (acquired immunodeficiency syndrome).  Often, people who have these STIs do not have symptoms. Even without symptoms, these infections can be spread from person to person and require treatment.  How can these conditions affect me?  STIs can be treated, and many STIs can be cured. However, some STIs cannot be cured and will affect you for the rest of your life.  Certain STIs may:  Require you to take medicine for the rest of your life.  Affect your ability to have children (your fertility).  Increase your risk for developing another STI or certain serious health conditions. These may include:  Cervical cancer.  Head and neck cancer.  Pelvic inflammatory disease (PID), in women.  Organ damage or damage to other parts of your body, if the infection spreads.  Cause problems during pregnancy and may be transmitted to the baby during the pregnancy or childbirth.  What can increase my risk?  You may have an increased risk for developing an STI if:  You have unprotected sex. Sex includes oral, vaginal, or anal sex.  You have more than one sex partner.  You have a sex partner who has multiple sex partners.  You have sex with someone who has an STI.  You have an STI or you had an STI before.  You inject drugs or have a sex partner or partners who inject drugs.  What actions can I take to prevent STIs?  The only way to completely prevent STIs is not to have  sex of any kind. This is called practicing abstinence. If you are sexually active, you can protect yourself and others by taking these actions to lower your risk of getting an STI:  Lifestyle  Avoid mixing alcohol, drugs, and sex. Alcohol and drug use can affect your ability to make good decisions and can lead to risky sexual behaviors.  Medicines  Ask your health care provider about taking pre-exposure prophylaxis (PrEP) to prevent HIV infection.  General information    Stay up to date on vaccinations. Certain vaccines can lower your risk of getting certain STIs, such as:  Hepatitis A and hepatitis B vaccines. You may have been vaccinated as a young child, but you will likely need a booster shot as a teen or young adult.  HPV (human papillomavirus) vaccine.  Have only one sex partner (be monogamous) or limit the number of sex partners you have.  Use methods that prevent the exchange of body fluids between partners (barrier protection) correctly every time you have sex. Barrier protection can be used during oral, vaginal, or anal sex. Commonly used barrier methods include:  Female condom.  Female condom.  Dental dam.  Use a new condom for every sex act from start to finish.  Get tested for STIs. Have your partners get tested, too.  If you test positive for an STI, follow recommendations from your health care provider about treatment and make sure your sex partners are tested and treated.  Birth control   pills, injections, implants, and intrauterine devices (IUDs) do not protect against STIs. To prevent both STIs and pregnancy, always use a condom with another form of birth control.  Some STIs, such as herpes, are spread through skin-to-skin contact. A condom may not protect you from getting such STIs. Avoid all sexual contact if you or your partners have herpes and there is an active flare with open sores.  Where to find more information  Learn more about STIs from:  Centers for Disease Control and Prevention:  More  information about specific STIs: cdc.gov/std  Places to get sexual health counseling and treatment for free or at a low cost: gettested.cdc.gov  [place]. Department of Health and Human Services: www.womenshealth.gov  Summary  Sexually transmitted infections (STIs) can spread through exchanging bodily fluids during sexual contact. Fluids include saliva, semen, blood, vaginal mucus, and urine.  You may have an increased risk for developing an STI if you have unprotected sex. Sex includes oral, vaginal, or anal sex.  If you do have sex, limit your number of sex partners and use barrier protection every time you have sex.  This information is not intended to replace advice given to you by your health care provider. Make sure you discuss any questions you have with your health care provider.  Document Revised: 01/19/2020 Document Reviewed: 01/19/2020  Elsevier Patient Education  2022 Elsevier Inc.

## 2021-06-14 NOTE — Progress Notes (Signed)
Subjective:    Patient ID: Melinda Roach, female    DOB: 1971-05-02, 50 y.o.   MRN: [id number]  HPI  Patient presents to clinic today with complaint of possible exposure to STD. She reports a guy that she was seeing that she just found out has had a girlfriend of 2 years. She is not having any symptoms. She would like STD screening today.  Review of Systems     Past Medical History:  Diagnosis Date   Depression    History of chicken pox     Current Outpatient Medications  Medication Sig Dispense Refill   Drospirenone (SLYND) 4 MG TABS Take 1 tablet by mouth daily. 84 tablet 3   sertraline (ZOLOFT) 100 MG tablet Take 1 tablet (100 mg total) by mouth daily. 90 tablet 3   No current facility-administered medications for this visit.    Allergies  Allergen Reactions   Penicillins Rash    Family History  Problem Relation Age of Onset   Diabetes Mother    Hyperlipidemia Mother    COPD Father    Depression Daughter     Social History   Socioeconomic History   Marital status: Divorced    Spouse name: Not on file   Number of children: Not on file   Years of education: Not on file   Highest education level: Not on file  Occupational History   Not on file  Tobacco Use   Smoking status: Never   Smokeless tobacco: Never  Vaping Use   Vaping Use: Never used  Substance and Sexual Activity   Alcohol use: Yes    Comment: occasional   Drug use: No   Sexual activity: Yes    Birth control/protection: None  Other Topics Concern   Not on file  Social History Narrative   Not on file   Social Determinants of Health   Financial Resource Strain: Not on file  Food Insecurity: Not on file  Transportation Needs: Not on file  Physical Activity: Not on file  Stress: Not on file  Social Connections: Not on file  Intimate Partner Violence: Not on file     Constitutional: Denies fever, malaise, fatigue, headache or abrupt weight changes.  Respiratory: Denies difficulty  breathing, shortness of breath, cough or sputum production.   Cardiovascular: Denies chest pain, chest tightness, palpitations or swelling in the hands or feet.  Gastrointestinal: Denies abdominal pain, bloating, constipation, diarrhea or blood in the stool.  GU: Denies urgency, frequency, pain with urination, burning sensation, blood in urine, odor or discharge. Skin: Denies redness, rashes, lesions or ulcercations.   No other specific complaints in a complete review of systems (except as listed in HPI above).  Objective:   Physical Exam   BP (!) 106/57 (BP Location: Right Arm, Patient Position: Sitting, Cuff Size: Large)   Pulse 94   Temp (!) 97.3 F (36.3 C) (Temporal)   Resp 18   Ht 5\' 5"  (1.651 m)   Wt 212 lb 9.6 oz (96.4 kg)   SpO2 99%   BMI 35.38 kg/m   Wt Readings from Last 3 Encounters:  04/11/21 212 lb (96.2 kg)  01/10/21 208 lb (94.3 kg)  06/28/20 202 lb (91.6 kg)    General: Appears her stated age, obese, in NAD. Skin: Warm, dry and intact.  HEENT: Head: normal shape and size; Eyes: sclera white and EOMs intact;  Cardiovascular: Normal rate and rhythm. S1,S2 noted.  No murmur, rubs or gallops noted. Pulmonary/Chest: Normal effort and positive  vesicular breath sounds. No respiratory distress. No wheezes, rales or ronchi noted.  Musculoskeletal: No difficulty with gait.  Neurological: Alert and oriented. Psychiatric: Mood and affect normal. Behavior is normal. Judgment and thought content normal.    BMET    Component Value Date/Time   NA 138 01/10/2021 1108   K 4.1 01/10/2021 1108   CL 104 01/10/2021 1108   CO2 26 01/10/2021 1108   GLUCOSE 146 (H) 01/10/2021 1108   BUN 18 01/10/2021 1108   CREATININE 0.89 01/10/2021 1108   CREATININE 0.68 11/17/2016 1147   CALCIUM 9.5 01/10/2021 1108   GFRNONAA >89 11/17/2016 1147   GFRAA >89 11/17/2016 1147    Lipid Panel     Component Value Date/Time   CHOL 198 01/10/2021 1108   TRIG 144.0 01/10/2021 1108   HDL  36.20 (L) 01/10/2021 2992   CHOLHDL 5 01/10/2021 1108   VLDL 28.8 01/10/2021 1108   LDLCALC 133 (H) 01/10/2021 1108    CBC    Component Value Date/Time   WBC 7.8 01/10/2021 1108   RBC 4.57 01/10/2021 1108   HGB 14.2 01/10/2021 1108   HCT 41.5 01/10/2021 1108   PLT 273.0 01/10/2021 1108   MCV 90.8 01/10/2021 1108   MCH 31.1 11/17/2016 1147   MCHC 34.3 01/10/2021 1108   RDW 14.8 01/10/2021 1108   LYMPHSABS 2,646 11/17/2016 1147   MONOABS 490 11/17/2016 1147   EOSABS 98 11/17/2016 1147   BASOSABS 0 11/17/2016 1147    Hgb A1C Lab Results  Component Value Date   HGBA1C 6.0 01/10/2021           Assessment & Plan:   Screen for STD:  Will check urine gonorrhea, chlamydia and trichomoniasis Will check HIV, syphilis and Hep C today  Will follow up after labs, return precautions discussed Nicki Reaper, NP This visit occurred during the SARS-CoV-2 public health emergency.  Safety protocols were in place, including screening questions prior to the visit, additional usage of staff PPE, and extensive cleaning of exam room while observing appropriate contact time as indicated for disinfecting solutions.

## 2021-06-15 ENCOUNTER — Other Ambulatory Visit: Payer: Medicaid Other

## 2021-06-15 DIAGNOSIS — Z113 Encounter for screening for infections with a predominantly sexual mode of transmission: Secondary | ICD-10-CM

## 2021-06-16 LAB — RPR: RPR Ser Ql: NONREACTIVE

## 2021-06-16 LAB — URINE CYTOLOGY ANCILLARY ONLY
Chlamydia: NEGATIVE
Comment: NEGATIVE
Comment: NEGATIVE
Comment: NORMAL
Neisseria Gonorrhea: NEGATIVE
Trichomonas: NEGATIVE

## 2021-06-16 LAB — HEPATITIS C ANTIBODY
Hepatitis C Ab: NONREACTIVE
SIGNAL TO CUT-OFF: 0.02 (ref <1.00)

## 2021-06-16 LAB — HIV ANTIBODY (ROUTINE TESTING W REFLEX): HIV 1&2 Ab, 4th Generation: NONREACTIVE

## 2021-07-11 ENCOUNTER — Telehealth: Payer: Self-pay

## 2021-07-11 ENCOUNTER — Encounter: Payer: Self-pay | Admitting: Internal Medicine

## 2021-07-11 NOTE — Telephone Encounter (Signed)
Patient notified to call school guidance office or central office to get a copy of immunizations

## 2021-07-11 NOTE — Telephone Encounter (Signed)
Copied from CRM [id number]. Topic: General - Other >> Jul 11, 2021  1:27 PM Wyonia Hough E wrote: Reason for CRM: Pt called and asked to speak with helen/ she stated she usually reaches out to helen for things/ pt needs a copy of her immunization records and wanted to speak with helen about how to go about this/ please advise

## 2021-10-31 ENCOUNTER — Other Ambulatory Visit: Payer: Self-pay | Admitting: Obstetrics and Gynecology

## 2021-10-31 MED ORDER — NITROFURANTOIN MONOHYD MACRO 100 MG PO CAPS: 100.0000 mg | ORAL_CAPSULE | Freq: Two times a day (BID) | ORAL | 0 refills | Status: AC

## 2021-11-28 ENCOUNTER — Encounter: Payer: Self-pay | Admitting: Obstetrics and Gynecology

## 2022-02-22 ENCOUNTER — Other Ambulatory Visit: Payer: Self-pay | Admitting: Internal Medicine

## 2022-02-22 NOTE — Telephone Encounter (Signed)
Requested medication (s) are due for refill today:   Provider to review ? ?Requested medication (s) are on the active medication list:   Yes ? ?Future visit scheduled:   No ? ? ?Last ordered: 01/10/2021 #90, 3 refills ? ?Returned because this is a non delegated refill per the protocol.    ? ?Requested Prescriptions  ?Pending Prescriptions Disp Refills  ? sertraline (ZOLOFT) 100 MG tablet [Pharmacy Med Name: SERTRALINE HCL 100 MG TABLET] 90 tablet 3  ?  Sig: TAKE 1 TABLET BY MOUTH EVERY DAY  ?  ? There is no refill protocol information for this order  ?  ? ?

## 2022-04-29 DIAGNOSIS — R070 Pain in throat: Secondary | ICD-10-CM | POA: Diagnosis not present

## 2022-04-29 DIAGNOSIS — J039 Acute tonsillitis, unspecified: Secondary | ICD-10-CM | POA: Diagnosis not present

## 2022-05-01 ENCOUNTER — Telehealth (INDEPENDENT_AMBULATORY_CARE_PROVIDER_SITE_OTHER): Payer: Medicaid Other | Admitting: Internal Medicine

## 2022-05-01 ENCOUNTER — Encounter: Payer: Self-pay | Admitting: Internal Medicine

## 2022-05-01 DIAGNOSIS — B9789 Other viral agents as the cause of diseases classified elsewhere: Secondary | ICD-10-CM | POA: Diagnosis not present

## 2022-05-01 DIAGNOSIS — J329 Chronic sinusitis, unspecified: Secondary | ICD-10-CM

## 2022-05-01 MED ORDER — PREDNISONE 10 MG PO TABS
ORAL_TABLET | ORAL | 0 refills | Status: DC
Start: 2022-05-01 — End: 2022-05-23

## 2022-05-01 NOTE — Patient Instructions (Signed)

## 2022-05-01 NOTE — Progress Notes (Signed)
Virtual Visit via Video Note ? ?I connected with Melinda Roach on 05/01/22 at 11:20 AM EDT by a video enabled telemedicine application and verified that I am speaking with the correct person using two identifiers. ? ?Location: ?Patient: Home ?Provider: Office ? ?Persons participating in this video call: Webb Silversmith, NP and Melinda Roach ?  ?I discussed the limitations of evaluation and management by telemedicine and the availability of in person appointments. The patient expressed understanding and agreed to proceed. ? ?History of Present Illness: ? ?Patient reports fever, ear pain, nasal congestion and sore throat.  This started 4 days ago. She is not blowing anything out of her nose. She is having some difficulty swallowing. She denies headache, runny nose, cough or shortness of breath, nausea, vomiting or diarrhea. She has had a negative home covid test. She has tried Ibuprofen and Tylenol OTC with minimal relief of symptoms. She went to urgent care, rapid strep was negative but she was treated with antibiotics based off what her throat looked like.  She has been taking the Augmentin as prescribed but does not feel like she is getting much better. ? ? ?Past Medical History:  ?Diagnosis Date  ? Depression   ? History of chicken pox   ? ? ?Current Outpatient Medications  ?Medication Sig Dispense Refill  ? Drospirenone (SLYND) 4 MG TABS Take 1 tablet by mouth daily. 84 tablet 3  ? sertraline (ZOLOFT) 100 MG tablet Take 1 tablet (100 mg total) by mouth daily. 90 tablet 3  ? ?No current facility-administered medications for this visit.  ? ? ?Allergies  ?Allergen Reactions  ? Penicillins Rash  ? ? ?Family History  ?Problem Relation Age of Onset  ? Diabetes Mother   ? Hyperlipidemia Mother   ? COPD Father   ? Depression Daughter   ? ? ?Social History  ? ?Socioeconomic History  ? Marital status: Divorced  ?  Spouse name: Not on file  ? Number of children: Not on file  ? Years of education: Not on file  ? Highest education  level: Not on file  ?Occupational History  ? Not on file  ?Tobacco Use  ? Smoking status: Never  ? Smokeless tobacco: Never  ?Vaping Use  ? Vaping Use: Never used  ?Substance and Sexual Activity  ? Alcohol use: Yes  ?  Comment: occasional  ? Drug use: No  ? Sexual activity: Yes  ?  Birth control/protection: None  ?Other Topics Concern  ? Not on file  ?Social History Narrative  ? Not on file  ? ?Social Determinants of Health  ? ?Financial Resource Strain: Not on file  ?Food Insecurity: Not on file  ?Transportation Needs: Not on file  ?Physical Activity: Not on file  ?Stress: Not on file  ?Social Connections: Not on file  ?Intimate Partner Violence: Not on file  ? ? ? ?Constitutional: Pt reports fever. Denies headache or abrupt weight changes.  ?HEENT: Patient reports ear pain, nasal congestion and sore throat.  Denies eye pain, eye redness, ringing in the ears, wax buildup, runny nose, nasal congestion, bloody nose. ?Respiratory: Denies difficulty breathing, shortness of breath, cough or sputum production.   ?Cardiovascular: Denies chest pain, chest tightness, palpitations or swelling in the hands or feet.  ?Gastrointestinal: Denies abdominal pain, bloating, constipation, diarrhea or blood in the stool.  ? ?No other specific complaints in a complete review of systems (except as listed in HPI above). ? ?  ?Observations/Objective: ? ? ?Wt Readings from Last 3 Encounters:  ?  06/14/21 212 lb 9.6 oz (96.4 kg)  ?04/11/21 212 lb (96.2 kg)  ?01/10/21 208 lb (94.3 kg)  ? ? ?General: Appears her stated age, appears unwell but in NAD. ?HEENT:  Nose: congestion noted ; Throat/Mouth: hoarseness noted ?Pulmonary/Chest: Normal effort. No respiratory distress.  ?Neurological: Alert and oriented.  ? ?BMET ?   ?Component Value Date/Time  ? NA 138 01/10/2021 1108  ? K 4.1 01/10/2021 1108  ? CL 104 01/10/2021 1108  ? CO2 26 01/10/2021 1108  ? GLUCOSE 146 (H) 01/10/2021 1108  ? BUN 18 01/10/2021 1108  ? CREATININE 0.89 01/10/2021 1108  ?  CREATININE 0.68 11/17/2016 1147  ? CALCIUM 9.5 01/10/2021 1108  ? [facility] >89 11/17/2016 1147  ? GFRAA >89 11/17/2016 1147  ? ? ?Lipid Panel  ?   ?Component Value Date/Time  ? CHOL 198 01/10/2021 1108  ? TRIG 144.0 01/10/2021 1108  ? HDL 36.20 (L) 01/10/2021 1108  ? CHOLHDL 5 01/10/2021 1108  ? VLDL 28.8 01/10/2021 1108  ? LDLCALC 133 (H) 01/10/2021 1108  ? ? ?CBC ?   ?Component Value Date/Time  ? WBC 7.8 01/10/2021 1108  ? RBC 4.57 01/10/2021 1108  ? HGB 14.2 01/10/2021 1108  ? HCT 41.5 01/10/2021 1108  ? PLT 273.0 01/10/2021 1108  ? MCV 90.8 01/10/2021 1108  ? MCH 31.1 11/17/2016 1147  ? MCHC 34.3 01/10/2021 1108  ? RDW 14.8 01/10/2021 1108  ? LYMPHSABS 2,646 11/17/2016 1147  ? MONOABS 490 11/17/2016 1147  ? EOSABS 98 11/17/2016 1147  ? BASOSABS 0 11/17/2016 1147  ? ? ?Hgb A1C ?Lab Results  ?Component Value Date  ? HGBA1C 6.0 01/10/2021  ? ? ? ? ? ?Assessment and Plan: ? ?Fever, Ear Pain, Nasal Congestion, Sore Throat: ? ?Urgent care notes are not available for review at this time ?We will have her continue Augmentin until course is completed ?We will add Pred taper x6 days ?OK to continue Tylenol and ibuprofen OTC as needed for fever and pain ?If no improvement in symptoms, would consider switching antibiotic to azithromycin ? ?Schedule an appointment for your annual exam ? ?Follow Up Instructions: ? ?  ?I discussed the assessment and treatment plan with the patient. The patient was provided an opportunity to ask questions and all were answered. The patient agreed with the plan and demonstrated an understanding of the instructions. ?  ?The patient was advised to call back or seek an in-person evaluation if the symptoms worsen or if the condition fails to improve as anticipated. ? ? ? ? ?Webb Silversmith, NP ? ?

## 2022-05-16 ENCOUNTER — Other Ambulatory Visit: Payer: Self-pay

## 2022-05-16 MED ORDER — SLYND 4 MG PO TABS: 1.0000 | ORAL_TABLET | Freq: Every day | ORAL | 0 refills | Status: DC

## 2022-05-23 ENCOUNTER — Ambulatory Visit (INDEPENDENT_AMBULATORY_CARE_PROVIDER_SITE_OTHER): Payer: Medicaid Other | Admitting: Family Medicine

## 2022-05-23 ENCOUNTER — Encounter: Payer: Self-pay | Admitting: Family Medicine

## 2022-05-23 ENCOUNTER — Other Ambulatory Visit (HOSPITAL_COMMUNITY)
Admission: RE | Admit: 2022-05-23 | Discharge: 2022-05-23 | Disposition: A | Discharge status: Home / Self Care | Payer: Medicaid Other | Source: Ambulatory Visit | Attending: Family Medicine | Admitting: Family Medicine

## 2022-05-23 VITALS — BP 102/60 | Ht 65.0 in | Wt 203.0 lb

## 2022-05-23 DIAGNOSIS — Z01419 Encounter for gynecological examination (general) (routine) without abnormal findings: Secondary | ICD-10-CM

## 2022-05-23 DIAGNOSIS — N912 Amenorrhea, unspecified: Secondary | ICD-10-CM

## 2022-05-23 DIAGNOSIS — R87610 Atypical squamous cells of undetermined significance on cytologic smear of cervix (ASC-US): Secondary | ICD-10-CM | POA: Diagnosis not present

## 2022-05-23 DIAGNOSIS — R7303 Prediabetes: Secondary | ICD-10-CM | POA: Insufficient documentation

## 2022-05-23 DIAGNOSIS — R87810 Cervical high risk human papillomavirus (HPV) DNA test positive: Secondary | ICD-10-CM

## 2022-05-23 DIAGNOSIS — Z3041 Encounter for surveillance of contraceptive pills: Secondary | ICD-10-CM | POA: Diagnosis not present

## 2022-05-23 MED ORDER — SLYND 4 MG PO TABS: 1.0000 | ORAL_TABLET | Freq: Every day | ORAL | 12 refills | Status: DC

## 2022-05-23 NOTE — Progress Notes (Signed)
   GYNECOLOGY ANNUAL PREVENTATIVE CARE ENCOUNTER NOTE  Subjective:   Melinda Roach is a 51 y.o. G74P4 female here for a routine annual gynecologic exam.  Current complaints:  Chief Complaint  Patient presents with   Annual Exam    No concerns  does self breast exams Taking slynd and likes this medication, reports no menses with the slynd.   Denies abnormal vaginal bleeding, discharge, pelvic pain, problems with intercourse or other gynecologic concerns.    Gynecologic History No LMP recorded (within years). (Menstrual status: Irregular Periods). Contraception: oral progesterone-only contraceptive Last Pap: 2022. Results were: abnormal- ASCUS HPV pos. Longstanding hx of minor changes even on LEEP (CIN1 or less) Last mammogram: 2021. Results were: normal  Health Maintenance Due  Topic Date Due   COVID-19 Vaccine (3 - Booster for Moderna series) 05/15/2020   MAMMOGRAM  06/10/2021   Zoster Vaccines- Shingrix (1 of 2) Never done    The following portions of the patient's history were reviewed and updated as appropriate: allergies, current medications, past family history, past medical history, past social history, past surgical history and problem list.  Review of Systems Pertinent items are noted in HPI.   Objective:  BP 102/60   Ht 5\' 5"  (1.651 m)   Wt 203 lb (92.1 kg)   LMP  (Within Years) Comment: patient reports 12 months or more, patient does report spotting.  BMI 33.78 kg/m  CONSTITUTIONAL: Well-developed, well-nourished female in no acute distress.  HENT:  Normocephalic, atraumatic, External right and left ear normal. Oropharynx is clear and moist EYES:  No scleral icterus.  NECK: Normal range of motion, supple, no masses.  Normal thyroid.  SKIN: Skin is warm and dry. No rash noted. Not diaphoretic. No erythema. No pallor. NEUROLOGIC: Alert and oriented to person, place, and time. Normal reflexes, muscle tone coordination. No cranial nerve deficit noted. PSYCHIATRIC:  Normal mood and affect. Normal behavior. Normal judgment and thought content. CARDIOVASCULAR: Normal heart rate noted, regular rhythm. 2+ distal pulses. RESPIRATORY: Effort and breath sounds normal, no problems with respiration noted. BREASTS: Symmetric in size. No masses, skin changes, nipple drainage, or lymphadenopathy. ABDOMEN: Soft,  no distention noted.  No tenderness, rebound or guarding.  PELVIC: Normal appearing external genitalia; Mildly atrophic vaginal mucosa and cervix. No abnormal discharge noted.  Pap smear obtained.  Normal uterine size, no other palpable masses, no uterine or adnexal tenderness. MUSCULOSKELETAL: Normal range of motion.    Assessment and Plan:  1) Annual gynecologic examination with pap smear:  Will follow up results of pap smear and manage accordingly.  Routine preventative health maintenance measures emphasized. Reviewed perimenopausal symptoms and management.   1. ASCUS with positive high risk HPV cervical - Cytology - PAP  2. Well woman exam with routine gynecological exam - Cytology - PAP - Follicle stimulating hormone - MM 3D SCREEN BREAST BILATERAL; Future - Recommended CRC screening through PCP  3. Amenorrhea - FSH to help see if patient had gone through menopause.   4. Prediabetes PCP follow up  Please refer to After Visit Summary for other counseling recommendations.   Return in about 1 year (around 05/24/2023) for Yearly wellness exam.  07/24/2023, MD, MPH, ABFM Attending Physician Center for [facility]

## 2022-05-24 LAB — FOLLICLE STIMULATING HORMONE: FSH: 4.6 m[IU]/mL

## 2022-05-26 LAB — CYTOLOGY - PAP
Adequacy: ABSENT
Comment: NEGATIVE
Comment: NEGATIVE
Comment: NEGATIVE
HPV 16: NEGATIVE
HPV 18 / 45: NEGATIVE
High risk HPV: POSITIVE — AB

## 2022-05-30 ENCOUNTER — Encounter: Payer: Self-pay | Admitting: Family Medicine

## 2022-05-31 ENCOUNTER — Telehealth: Payer: Self-pay | Admitting: Obstetrics and Gynecology

## 2022-05-31 NOTE — Telephone Encounter (Signed)
Called pt to schedule colpo with Dr. Logan Bores.  Potential appt on July 7th.  Left message for pt to call back to schedule.

## 2022-06-01 NOTE — Telephone Encounter (Signed)
I contacted patient via phone. I advised about scheduling Colposcopy procedure appointment. Patient denied scheduling patient states "she didn't want to go thru that and will see what happens ". I advised patient to give [place] a call if she changes her mind!. Please advise?

## 2022-06-02 ENCOUNTER — Encounter: Payer: Self-pay | Admitting: Family Medicine

## 2022-06-06 NOTE — Telephone Encounter (Signed)
Recall letter has been entered to notify patient in three month to call back to schedule.

## 2022-06-19 ENCOUNTER — Ambulatory Visit
Admission: RE | Admit: 2022-06-19 | Discharge: 2022-06-19 | Disposition: A | Discharge status: Home / Self Care | Payer: Medicaid Other | Source: Ambulatory Visit | Attending: Family Medicine | Admitting: Family Medicine

## 2022-06-19 DIAGNOSIS — Z1231 Encounter for screening mammogram for malignant neoplasm of breast: Secondary | ICD-10-CM | POA: Insufficient documentation

## 2022-06-19 DIAGNOSIS — Z01419 Encounter for gynecological examination (general) (routine) without abnormal findings: Secondary | ICD-10-CM

## 2022-06-21 ENCOUNTER — Other Ambulatory Visit: Payer: Self-pay | Admitting: *Deleted

## 2022-06-21 ENCOUNTER — Inpatient Hospital Stay
Admission: RE | Admit: 2022-06-21 | Discharge: 2022-06-21 | Disposition: A | Discharge status: Home / Self Care | Payer: Self-pay | Source: Ambulatory Visit | Attending: *Deleted | Admitting: *Deleted

## 2022-06-21 DIAGNOSIS — Z1231 Encounter for screening mammogram for malignant neoplasm of breast: Secondary | ICD-10-CM

## 2022-08-09 ENCOUNTER — Other Ambulatory Visit: Payer: Self-pay | Admitting: Family Medicine

## 2022-08-09 DIAGNOSIS — Z3041 Encounter for surveillance of contraceptive pills: Secondary | ICD-10-CM

## 2022-08-09 DIAGNOSIS — Z01419 Encounter for gynecological examination (general) (routine) without abnormal findings: Secondary | ICD-10-CM

## 2022-08-09 DIAGNOSIS — N912 Amenorrhea, unspecified: Secondary | ICD-10-CM

## 2022-08-11 ENCOUNTER — Other Ambulatory Visit: Payer: Self-pay

## 2022-08-11 ENCOUNTER — Telehealth: Payer: Self-pay | Admitting: Family Medicine

## 2022-08-11 DIAGNOSIS — Z3041 Encounter for surveillance of contraceptive pills: Secondary | ICD-10-CM

## 2022-08-11 DIAGNOSIS — N912 Amenorrhea, unspecified: Secondary | ICD-10-CM

## 2022-08-11 DIAGNOSIS — Z01419 Encounter for gynecological examination (general) (routine) without abnormal findings: Secondary | ICD-10-CM

## 2022-08-11 MED ORDER — SLYND 4 MG PO TABS: 1.0000 | ORAL_TABLET | Freq: Every day | ORAL | 12 refills | Status: DC

## 2022-08-11 NOTE — Telephone Encounter (Signed)
Reached out to pt per Dr. Alvester Morin concerning colposcopy.  (Recall 9/23) Pt is not wanting to move ahead with the colposcopy.  She is going to wait for the results of her next pap smear.

## 2023-01-23 ENCOUNTER — Encounter: Payer: Self-pay | Admitting: Internal Medicine

## 2023-03-29 ENCOUNTER — Ambulatory Visit: Payer: Medicaid Other | Admitting: Internal Medicine

## 2023-04-02 ENCOUNTER — Encounter: Payer: Self-pay | Admitting: Internal Medicine

## 2023-04-02 ENCOUNTER — Other Ambulatory Visit (HOSPITAL_COMMUNITY)
Admission: RE | Admit: 2023-04-02 | Discharge: 2023-04-02 | Disposition: A | Discharge status: Home / Self Care | Payer: Medicaid Other | Source: Ambulatory Visit | Attending: Internal Medicine | Admitting: Internal Medicine

## 2023-04-02 ENCOUNTER — Ambulatory Visit: Payer: Medicaid Other | Admitting: Internal Medicine

## 2023-04-02 VITALS — BP 122/76 | HR 94 | Temp 97.5°F | Ht 65.5 in | Wt 205.0 lb

## 2023-04-02 DIAGNOSIS — E66811 Obesity, class 1: Secondary | ICD-10-CM | POA: Insufficient documentation

## 2023-04-02 DIAGNOSIS — L989 Disorder of the skin and subcutaneous tissue, unspecified: Secondary | ICD-10-CM

## 2023-04-02 DIAGNOSIS — Z113 Encounter for screening for infections with a predominantly sexual mode of transmission: Secondary | ICD-10-CM | POA: Insufficient documentation

## 2023-04-02 DIAGNOSIS — E6609 Other obesity due to excess calories: Secondary | ICD-10-CM

## 2023-04-02 DIAGNOSIS — R7303 Prediabetes: Secondary | ICD-10-CM | POA: Diagnosis not present

## 2023-04-02 DIAGNOSIS — Z0001 Encounter for general adult medical examination with abnormal findings: Secondary | ICD-10-CM

## 2023-04-02 DIAGNOSIS — Z6833 Body mass index (BMI) 33.0-33.9, adult: Secondary | ICD-10-CM

## 2023-04-02 LAB — CBC
Hemoglobin: 14.2 g/dL (ref 11.7–15.5)
MCH: 32.7 pg (ref 27.0–33.0)
WBC: 8.7 10*3/uL (ref 3.8–10.8)

## 2023-04-02 NOTE — Progress Notes (Signed)
Subjective:    Patient ID: Melinda Roach, female    DOB: [birth year], 52 y.o.   MRN: [id number]  HPI  Patient presents to clinic today for her annual exam.  Flu: Never Tetanus: 02/2014 COVID: Moderna x 2 Shingrix: Never Pap smear: 05/2022, abnormal Mammogram: 06/2022 Colon screening: 11/2015 Vision screening: as needed Dentist: biannually  Diet: She does eat meat. She consumes fruits and veggies. She tries to avoid fried foods. She drinks mostly water, soda. Exercise: Softball  Review of Systems     Past Medical History:  Diagnosis Date   Depression    History of chicken pox     Current Outpatient Medications  Medication Sig Dispense Refill   amoxicillin-clavulanate (AUGMENTIN) 875-125 MG tablet SMARTSIG:1 Tablet(s) By Mouth Every 12 Hours     Drospirenone (SLYND) 4 MG TABS Take 1 tablet by mouth daily. 28 tablet 12   No current facility-administered medications for this visit.    Allergies  Allergen Reactions   Penicillins Rash    Family History  Problem Relation Age of Onset   Diabetes Mother    Hyperlipidemia Mother    COPD Father    Depression Daughter     Social History   Socioeconomic History   Marital status: Divorced    Spouse name: Not on file   Number of children: Not on file   Years of education: Not on file   Highest education level: Not on file  Occupational History   Not on file  Tobacco Use   Smoking status: Never   Smokeless tobacco: Never  Vaping Use   Vaping Use: Never used  Substance and Sexual Activity   Alcohol use: Yes    Comment: occasional   Drug use: No   Sexual activity: Yes    Birth control/protection: None  Other Topics Concern   Not on file  Social History Narrative   Not on file   Social Determinants of Health   Financial Resource Strain: Not on file  Food Insecurity: Not on file  Transportation Needs: Not on file  Physical Activity: Insufficiently Active (11/19/2017)   Exercise Vital Sign    Days of  Exercise per Week: 3 days    Minutes of Exercise per Session: 30 min  Stress: No Stress Concern Present (11/19/2017)   [employer] of Occupational Health - Occupational Stress Questionnaire    Feeling of Stress : Only a little  Social Connections: Somewhat Isolated (11/19/2017)   Social Connection and Isolation Panel [NHANES]    Frequency of Communication with Friends and Family: More than three times a week    Frequency of Social Gatherings with Friends and Family: More than three times a week    Attends Religious Services: 1 to 4 times per year    Active Member of [employer] or Organizations: No    Attends Banker Meetings: Never    Marital Status: Divorced  Catering manager Violence: Unknown (11/19/2017)   Humiliation, Afraid, Rape, and Kick questionnaire    Fear of Current or Ex-Partner: Patient declined    Emotionally Abused: Patient declined    Physically Abused: Patient declined    Sexually Abused: Patient declined     Constitutional: Denies fever, malaise, fatigue, headache or abrupt weight changes.  HEENT: Denies eye pain, eye redness, ear pain, ringing in the ears, wax buildup, runny nose, nasal congestion, bloody nose, or sore throat. Respiratory: Denies difficulty breathing, shortness of breath, cough or sputum production.   Cardiovascular: Denies chest pain, chest tightness, palpitations  or swelling in the hands or feet.  Gastrointestinal: Denies abdominal pain, bloating, constipation, diarrhea or blood in the stool.  GU: Denies urgency, frequency, pain with urination, burning sensation, blood in urine, odor or discharge. Musculoskeletal: Denies decrease in range of motion, difficulty with gait, muscle pain or joint pain and swelling.  Skin: Denies redness, rashes, lesions or ulcercations.  Neurological: Pt reports insomnia. Denies dizziness, difficulty with memory, difficulty with speech or problems with balance and coordination.  Psych: Patient has a history  of depression.  Denies anxiety, SI/HI.  No other specific complaints in a complete review of systems (except as listed in HPI above).  Objective:   Physical Exam  BP 122/76 (BP Location: Right Arm, Patient Position: Sitting, Cuff Size: Normal)   Pulse 94   Temp (!) 97.5 F (36.4 C) (Temporal)   Ht 5' 5.5" (1.664 m)   Wt 205 lb (93 kg)   SpO2 98%   BMI 33.59 kg/m   Wt Readings from Last 3 Encounters:  05/23/22 203 lb (92.1 kg)  06/14/21 212 lb 9.6 oz (96.4 kg)  04/11/21 212 lb (96.2 kg)    General: Appears her stated age, obese, in NAD. Skin: 2mm hyperpigmented scaly lesion underneath right eye. HEENT: Head: normal shape and size; Eyes: sclera white, no icterus, conjunctiva pink, PERRLA and EOMs intact;  Neck:  Neck supple, trachea midline. No masses, lumps or thyromegaly present.  Cardiovascular: Normal rate and rhythm. S1,S2 noted.  No murmur, rubs or gallops noted. No JVD or BLE edema. No carotid bruits noted. Pulmonary/Chest: Normal effort and positive vesicular breath sounds. No respiratory distress. No wheezes, rales or ronchi noted.  Abdomen:  Normal bowel sounds.  Musculoskeletal: Strength 5/5 BUE/BLE. No difficulty with gait.  Neurological: Alert and oriented. Cranial nerves II-XII grossly intact. Coordination normal.  Psychiatric: Mood and affect normal. Behavior is normal. Judgment and thought content normal.    BMET    Component Value Date/Time   NA 138 01/10/2021 1108   K 4.1 01/10/2021 1108   CL 104 01/10/2021 1108   CO2 26 01/10/2021 1108   GLUCOSE 146 (H) 01/10/2021 1108   BUN 18 01/10/2021 1108   CREATININE 0.89 01/10/2021 1108   CREATININE 0.68 11/17/2016 1147   CALCIUM 9.5 01/10/2021 1108   GFRNONAA >89 11/17/2016 1147   GFRAA >89 11/17/2016 1147    Lipid Panel     Component Value Date/Time   CHOL 198 01/10/2021 1108   TRIG 144.0 01/10/2021 1108   HDL 36.20 (L) 01/10/2021 1108   CHOLHDL 5 01/10/2021 1108   VLDL 28.8 01/10/2021 1108    LDLCALC 133 (H) 01/10/2021 1108    CBC    Component Value Date/Time   WBC 7.8 01/10/2021 1108   RBC 4.57 01/10/2021 1108   HGB 14.2 01/10/2021 1108   HCT 41.5 01/10/2021 1108   PLT 273.0 01/10/2021 1108   MCV 90.8 01/10/2021 1108   MCH 31.1 11/17/2016 1147   MCHC 34.3 01/10/2021 1108   RDW 14.8 01/10/2021 1108   LYMPHSABS 2,646 11/17/2016 1147   MONOABS 490 11/17/2016 1147   EOSABS 98 11/17/2016 1147   BASOSABS 0 11/17/2016 1147    Hgb A1C Lab Results  Component Value Date   HGBA1C 6.0 01/10/2021           Assessment & Plan:   Preventative Health Maintenance:  Encouraged her to get a flu shot in the fall Tetanus UTD Encouraged her to get her COVID booster Discussed Shingrix vaccine, she will check  coverage with her insurance company and schedule visit if she would like to have this done Pap smear due 05/2023, by GYN Mammogram ordered by GYN Colon screening UTD Encouraged her to consume a balanced diet and exercise regimen Advised her to see an eye doctor and dentist annually We will check CBC, c-Met, lipid, A1c today  RTC in 6 months, follow-up chronic conditions  Nicki Reaper, NP

## 2023-04-02 NOTE — Patient Instructions (Signed)

## 2023-04-02 NOTE — Assessment & Plan Note (Signed)
Encouraged diet and exercise for weight loss ?

## 2023-04-03 ENCOUNTER — Telehealth: Payer: Self-pay

## 2023-04-03 LAB — CBC
HCT: 41.8 % (ref 35.0–45.0)
MCHC: 34 g/dL (ref 32.0–36.0)
MCV: 96.3 fL (ref 80.0–100.0)
MPV: 10.1 fL (ref 7.5–12.5)
Platelets: 297 10*3/uL (ref 140–400)
RBC: 4.34 10*6/uL (ref 3.80–5.10)
RDW: 12.9 % (ref 11.0–15.0)

## 2023-04-03 LAB — HEMOGLOBIN A1C
Hgb A1c MFr Bld: 7 % of total Hgb — ABNORMAL HIGH (ref <5.7)
Mean Plasma Glucose: 154 mg/dL
eAG (mmol/L): 8.5 mmol/L

## 2023-04-03 LAB — COMPLETE METABOLIC PANEL WITH GFR
AG Ratio: 1.5 (calc) (ref 1.0–2.5)
ALT: 20 U/L (ref 6–29)
AST: 19 U/L (ref 10–35)
Albumin: 4.5 g/dL (ref 3.6–5.1)
Alkaline phosphatase (APISO): 53 U/L (ref 37–153)
BUN: 13 mg/dL (ref 7–25)
CO2: 24 mmol/L (ref 20–32)
Calcium: 9.1 mg/dL (ref 8.6–10.4)
Chloride: 104 mmol/L (ref 98–110)
Creat: 0.94 mg/dL (ref 0.50–1.03)
Globulin: 3.1 g/dL (calc) (ref 1.9–3.7)
Glucose, Bld: 146 mg/dL — ABNORMAL HIGH (ref 65–99)
Potassium: 4.1 mmol/L (ref 3.5–5.3)
Sodium: 139 mmol/L (ref 135–146)
Total Bilirubin: 0.6 mg/dL (ref 0.2–1.2)
Total Protein: 7.6 g/dL (ref 6.1–8.1)
eGFR: 73 mL/min/{1.73_m2} (ref >=60)

## 2023-04-03 LAB — LIPID PANEL
Cholesterol: 176 mg/dL (ref <200)
HDL: 34 mg/dL — ABNORMAL LOW (ref >=50)
LDL Cholesterol (Calc): 105 mg/dL (calc) — ABNORMAL HIGH
Non-HDL Cholesterol (Calc): 142 mg/dL (calc) — ABNORMAL HIGH (ref <130)
Total CHOL/HDL Ratio: 5.2 (calc) — ABNORMAL HIGH (ref <5.0)
Triglycerides: 242 mg/dL — ABNORMAL HIGH (ref <150)

## 2023-04-03 LAB — RPR: RPR Ser Ql: NONREACTIVE

## 2023-04-03 LAB — HEPATITIS C ANTIBODY: Hepatitis C Ab: NONREACTIVE

## 2023-04-03 LAB — HIV ANTIBODY (ROUTINE TESTING W REFLEX): HIV 1&2 Ab, 4th Generation: NONREACTIVE

## 2023-04-03 NOTE — Telephone Encounter (Signed)
Informed patient of results.  Appointment scheduled. °

## 2023-04-03 NOTE — Telephone Encounter (Signed)
-----   Message from Lorre Munroe, NP sent at 04/03/2023  8:40 AM EDT ----- Have patient set up an appointment with me to discuss her labs.  She has new onset diabetes.

## 2023-04-04 LAB — URINE CYTOLOGY ANCILLARY ONLY
Chlamydia: NEGATIVE
Comment: NEGATIVE
Comment: NEGATIVE
Comment: NORMAL
Neisseria Gonorrhea: NEGATIVE
Trichomonas: NEGATIVE

## 2023-04-06 ENCOUNTER — Ambulatory Visit: Payer: Medicaid Other | Admitting: Internal Medicine

## 2023-04-06 ENCOUNTER — Encounter: Payer: Self-pay | Admitting: Internal Medicine

## 2023-04-06 VITALS — BP 121/61 | HR 83 | Temp 98.3°F | Resp 17 | Ht 65.5 in | Wt 203.6 lb

## 2023-04-06 DIAGNOSIS — E1165 Type 2 diabetes mellitus with hyperglycemia: Secondary | ICD-10-CM | POA: Diagnosis not present

## 2023-04-06 DIAGNOSIS — E785 Hyperlipidemia, unspecified: Secondary | ICD-10-CM | POA: Diagnosis not present

## 2023-04-06 DIAGNOSIS — Z6833 Body mass index (BMI) 33.0-33.9, adult: Secondary | ICD-10-CM

## 2023-04-06 DIAGNOSIS — E6609 Other obesity due to excess calories: Secondary | ICD-10-CM | POA: Diagnosis not present

## 2023-04-06 DIAGNOSIS — E1169 Type 2 diabetes mellitus with other specified complication: Secondary | ICD-10-CM

## 2023-04-06 NOTE — Assessment & Plan Note (Signed)
She declines starting cholesterol-lowering medication at this time Encourage low saturated fat diet and exercise for weight loss

## 2023-04-06 NOTE — Patient Instructions (Signed)

## 2023-04-06 NOTE — Assessment & Plan Note (Signed)
Discussed diabetes and standards of medical care Encourage low-carb diet and exercise for weight loss She declines referral to diabetes education and nutrition at this time Encourage routine eye exam Encouraged routine foot exam

## 2023-04-06 NOTE — Progress Notes (Signed)
Subjective:    Patient ID: Melinda Roach, female    DOB: 1971-01-10, 52 y.o.   MRN: [id number]  HPI  Patient presents to clinic today for follow-up of labs.  Her recent A1c was 7% indicating a new onset diabetes diagnosis.  Her LDL was 105, triglycerides 242.  She is not currently taking any oral diabetic medication at this time.  She does not check her sugars.  She is not taking any cholesterol-lowering medication at this time.  Review of Systems     Past Medical History:  Diagnosis Date   Depression    History of chicken pox     Current Outpatient Medications  Medication Sig Dispense Refill   Drospirenone (SLYND) 4 MG TABS Take 1 tablet by mouth daily. 28 tablet 12   No current facility-administered medications for this visit.    Allergies  Allergen Reactions   Penicillins Rash    Family History  Problem Relation Age of Onset   Diabetes Mother    Hyperlipidemia Mother    Lung cancer Mother    COPD Father    Depression Daughter     Social History   Socioeconomic History   Marital status: Divorced    Spouse name: Not on file   Number of children: Not on file   Years of education: Not on file   Highest education level: Not on file  Occupational History   Not on file  Tobacco Use   Smoking status: Never   Smokeless tobacco: Never  Vaping Use   Vaping Use: Never used  Substance and Sexual Activity   Alcohol use: Yes    Comment: occasional   Drug use: No   Sexual activity: Yes    Birth control/protection: None  Other Topics Concern   Not on file  Social History Narrative   Not on file   Social Determinants of Health   Financial Resource Strain: Not on file  Food Insecurity: Not on file  Transportation Needs: Not on file  Physical Activity: Insufficiently Active (11/19/2017)   Exercise Vital Sign    Days of Exercise per Week: 3 days    Minutes of Exercise per Session: 30 min  Stress: No Stress Concern Present (11/19/2017)   [employer] of  Occupational Health - Occupational Stress Questionnaire    Feeling of Stress : Only a little  Social Connections: Somewhat Isolated (11/19/2017)   Social Connection and Isolation Panel [NHANES]    Frequency of Communication with Friends and Family: More than three times a week    Frequency of Social Gatherings with Friends and Family: More than three times a week    Attends Religious Services: 1 to 4 times per year    Active Member of [employer] or Organizations: No    Attends Banker Meetings: Never    Marital Status: Divorced  Catering manager Violence: Unknown (11/19/2017)   Humiliation, Afraid, Rape, and Kick questionnaire    Fear of Current or Ex-Partner: Patient declined    Emotionally Abused: Patient declined    Physically Abused: Patient declined    Sexually Abused: Patient declined     Constitutional: Denies fever, malaise, fatigue, headache or abrupt weight changes.  HEENT: Denies eye pain, eye redness, ear pain, ringing in the ears, wax buildup, runny nose, nasal congestion, bloody nose, or sore throat. Respiratory: Denies difficulty breathing, shortness of breath, cough or sputum production.   Cardiovascular: Denies chest pain, chest tightness, palpitations or swelling in the hands or feet.  Gastrointestinal: Denies  abdominal pain, bloating, constipation, diarrhea or blood in the stool.  GU: Denies urgency, frequency, pain with urination, burning sensation, blood in urine, odor or discharge. Musculoskeletal: Denies decrease in range of motion, difficulty with gait, muscle pain or joint pain and swelling.  Skin: Denies redness, rashes, lesions or ulcercations.  Neurological: Denies dizziness, difficulty with memory, difficulty with speech or problems with balance and coordination.  Psych: Patient has a history of depression.  Denies anxiety, SI/HI.  No other specific complaints in a complete review of systems (except as listed in HPI above).  Objective:   Physical  Exam  BP 121/61 (BP Location: Right Arm, Patient Position: Sitting, Cuff Size: Normal)   Pulse 83   Temp 98.3 F (36.8 C) (Oral)   Resp 17   Ht 5' 5.5" (1.664 m)   Wt 203 lb 9.6 oz (92.4 kg)   SpO2 100%   BMI 33.37 kg/m   Wt Readings from Last 3 Encounters:  04/02/23 205 lb (93 kg)  05/23/22 203 lb (92.1 kg)  06/14/21 212 lb 9.6 oz (96.4 kg)    General: Appears her stated age, obese, in NAD. Skin: Warm, dry and intact. No ulcerations noted. Neck:  Neck supple, trachea midline. No masses, lumps or thyromegaly present.  Cardiovascular: Normal rate and rhythm. S1,S2 noted.  No murmur, rubs or gallops noted. No JVD or BLE edema. No carotid bruits noted. Pulmonary/Chest: Normal effort and positive vesicular breath sounds. No respiratory distress. No wheezes, rales or ronchi noted.  Musculoskeletal: No difficulty with gait.  Neurological: Alert and oriented.  Coordination normal.    BMET    Component Value Date/Time   NA 139 04/02/2023 1434   K 4.1 04/02/2023 1434   CL 104 04/02/2023 1434   CO2 24 04/02/2023 1434   GLUCOSE 146 (H) 04/02/2023 1434   BUN 13 04/02/2023 1434   CREATININE 0.94 04/02/2023 1434   CALCIUM 9.1 04/02/2023 1434   GFRNONAA >89 11/17/2016 1147   GFRAA >89 11/17/2016 1147    Lipid Panel     Component Value Date/Time   CHOL 176 04/02/2023 1434   TRIG 242 (H) 04/02/2023 1434   HDL 34 (L) 04/02/2023 1434   CHOLHDL 5.2 (H) 04/02/2023 1434   VLDL 28.8 01/10/2021 1108   LDLCALC 105 (H) 04/02/2023 1434    CBC    Component Value Date/Time   WBC 8.7 04/02/2023 1434   RBC 4.34 04/02/2023 1434   HGB 14.2 04/02/2023 1434   HCT 41.8 04/02/2023 1434   PLT 297 04/02/2023 1434   MCV 96.3 04/02/2023 1434   MCH 32.7 04/02/2023 1434   MCHC 34.0 04/02/2023 1434   RDW 12.9 04/02/2023 1434   LYMPHSABS 2,646 11/17/2016 1147   MONOABS 490 11/17/2016 1147   EOSABS 98 11/17/2016 1147   BASOSABS 0 11/17/2016 1147    Hgb A1C Lab Results  Component Value  Date   HGBA1C 7.0 (H) 04/02/2023            Assessment & Plan:     RTC in 3 months, follow-up chronic conditions Nicki Reaper, NP

## 2023-04-06 NOTE — Assessment & Plan Note (Signed)
Encouraged diet and exercise for weight loss ?

## 2023-06-25 ENCOUNTER — Ambulatory Visit (INDEPENDENT_AMBULATORY_CARE_PROVIDER_SITE_OTHER): Payer: Medicaid Other | Admitting: Certified Nurse Midwife

## 2023-06-25 ENCOUNTER — Encounter: Payer: Self-pay | Admitting: Certified Nurse Midwife

## 2023-06-25 ENCOUNTER — Other Ambulatory Visit (HOSPITAL_COMMUNITY)
Admission: RE | Admit: 2023-06-25 | Discharge: 2023-06-25 | Disposition: A | Discharge status: Home / Self Care | Payer: Medicaid Other | Source: Ambulatory Visit | Attending: Certified Nurse Midwife | Admitting: Certified Nurse Midwife

## 2023-06-25 VITALS — BP 113/60 | HR 79 | Ht 65.5 in | Wt 188.0 lb

## 2023-06-25 DIAGNOSIS — Z3041 Encounter for surveillance of contraceptive pills: Secondary | ICD-10-CM

## 2023-06-25 DIAGNOSIS — Z124 Encounter for screening for malignant neoplasm of cervix: Secondary | ICD-10-CM | POA: Diagnosis present

## 2023-06-25 DIAGNOSIS — Z1151 Encounter for screening for human papillomavirus (HPV): Secondary | ICD-10-CM | POA: Insufficient documentation

## 2023-06-25 DIAGNOSIS — Z1231 Encounter for screening mammogram for malignant neoplasm of breast: Secondary | ICD-10-CM

## 2023-06-25 DIAGNOSIS — N912 Amenorrhea, unspecified: Secondary | ICD-10-CM

## 2023-06-25 DIAGNOSIS — Z01419 Encounter for gynecological examination (general) (routine) without abnormal findings: Secondary | ICD-10-CM

## 2023-06-25 MED ORDER — SLYND 4 MG PO TABS
1.0000 | ORAL_TABLET | Freq: Every day | ORAL | 4 refills | Status: DC
Start: 2023-06-25 — End: 2024-07-02

## 2023-06-25 NOTE — Patient Instructions (Signed)

## 2023-06-25 NOTE — Progress Notes (Signed)
ANNUAL EXAM Patient name: Melinda Roach MRN [id number]  Date of birth: Apr 25, 1971 Chief Complaint:   Annual Exam  History of Present Illness:   Melinda Roach is a 52 y.o. G74P4 {race:25618} female being seen today for a routine annual exam.  Current complaints: ***  No LMP recorded. Patient is perimenopausal.   The pregnancy intention screening data noted above was reviewed. Potential methods of contraception were discussed. The patient elected to proceed with No data recorded.      Component Value Date/Time   DIAGPAP - Low grade squamous intraepithelial lesion (LSIL) (A) 05/23/2022 1532   DIAGPAP (A) 04/11/2021 1355    - Atypical squamous cells of undetermined significance (ASC-US)   DIAGPAP - Low grade squamous intraepithelial lesion (LSIL) (A) 04/09/2020 1337   HPVHIGH Positive (A) 05/23/2022 1532   HPVHIGH Positive (A) 04/11/2021 1355   HPVHIGH Positive (A) 04/09/2020 1337   ADEQPAP  05/23/2022 1532    Satisfactory for evaluation; transformation zone component ABSENT.   ADEQPAP  04/11/2021 1355    Satisfactory for evaluation; transformation zone component PRESENT.   ADEQPAP  04/09/2020 1337    Satisfactory for evaluation; transformation zone component PRESENT.    @MAMFINDINGS @   Last pap ***. Results were: {Pap findings:25134}. H/O abnormal pap: {yes/yes***/no:23866} Last mammogram: ***. Results were: {normal, abnormal, n/a:23837}. Family h/o breast cancer: {yes***/no:23838} Last colonoscopy: ***. Results were: {normal, abnormal, n/a:23837}. Family h/o colorectal cancer: {yes***/no:23838}     04/06/2023    3:00 PM 04/02/2023    2:32 PM 04/11/2021    1:50 PM 01/10/2021   10:49 AM 07/31/2019    9:08 AM  Depression screen PHQ 2/9  Decreased Interest 0 0 0 0 0  Down, Depressed, Hopeless 0 0 0 0 0  PHQ - 2 Score 0 0 0 0 0  Altered sleeping 0  0 0   Tired, decreased energy 0  0 0   Change in appetite 0  0 0   Feeling bad or failure about yourself  0  0 0   Trouble  concentrating 0  0 0   Moving slowly or fidgety/restless 0  0 0   Suicidal thoughts 0  0 0   PHQ-9 Score 0  0 0   Difficult doing work/chores Not difficult at all  Not difficult at all Not difficult at all         04/02/2023    2:32 PM 04/11/2021    1:51 PM  GAD 7 : Generalized Anxiety Score  Nervous, Anxious, on Edge 0 0  Control/stop worrying 0 0  Worry too much - different things 0 0  Trouble relaxing 0 0  Restless 0 0  Easily annoyed or irritable 0 0  Afraid - awful might happen 0 0  Total GAD 7 Score 0 0  Anxiety Difficulty  Not difficult at all     Review of Systems:   Pertinent items are noted in HPI Denies any headaches, blurred vision, fatigue, shortness of breath, chest pain, abdominal pain, abnormal vaginal discharge/itching/odor/irritation, problems with periods, bowel movements, urination, or intercourse unless otherwise stated above. Pertinent History Reviewed:  Reviewed past medical,surgical, social and family history.  Reviewed problem list, medications and allergies. Physical Assessment:   Vitals:   06/25/23 1332  BP: 113/60  Pulse: 79  Weight: 188 lb (85.3 kg)  Height: 5' 5.5" (1.664 m)  Body mass index is 30.81 kg/m.       Physical Exam   No results found for this or any previous  visit (from the past 24 hour(s)).  Assessment & Plan:  1) Well-Woman Exam  2) ***  Labs/procedures today: ***  Mammogram: {Mammo f/u:25212::"@ 52yo"}, or sooner if problems Colonoscopy: {TCS f/u:25213::"@ 52yo"}, or sooner if problems  Orders Placed This Encounter  Procedures  . MM 3D SCREENING MAMMOGRAM BILATERAL BREAST    Meds:  Meds ordered this encounter  Medications  . Drospirenone (SLYND) 4 MG TABS    Sig: Take 1 tablet (4 mg total) by mouth daily.    Dispense:  84 tablet    Refill:  4    Mail Order    Order Specific Question:   Supervising Provider    Answer:   Hildred Laser [[id number]]    Follow-up: Return in 1 year (on 06/24/2024).  Dominica Severin, CNM 06/25/2023 3:06 PM

## 2023-06-28 LAB — CYTOLOGY - PAP
Adequacy: ABSENT
Comment: NEGATIVE
Diagnosis: UNDETERMINED — AB
High risk HPV: NEGATIVE

## 2023-07-01 ENCOUNTER — Encounter: Payer: Self-pay | Admitting: Certified Nurse Midwife

## 2023-07-02 ENCOUNTER — Other Ambulatory Visit: Payer: Self-pay

## 2023-07-02 ENCOUNTER — Encounter: Payer: Self-pay | Admitting: Internal Medicine

## 2023-07-02 ENCOUNTER — Ambulatory Visit: Payer: Medicaid Other | Admitting: Internal Medicine

## 2023-07-02 VITALS — BP 120/66 | HR 74 | Temp 96.8°F | Wt 189.0 lb

## 2023-07-02 DIAGNOSIS — L989 Disorder of the skin and subcutaneous tissue, unspecified: Secondary | ICD-10-CM | POA: Diagnosis not present

## 2023-07-02 DIAGNOSIS — E1169 Type 2 diabetes mellitus with other specified complication: Secondary | ICD-10-CM

## 2023-07-02 DIAGNOSIS — E6609 Other obesity due to excess calories: Secondary | ICD-10-CM

## 2023-07-02 DIAGNOSIS — E785 Hyperlipidemia, unspecified: Secondary | ICD-10-CM

## 2023-07-02 DIAGNOSIS — E1165 Type 2 diabetes mellitus with hyperglycemia: Secondary | ICD-10-CM

## 2023-07-02 DIAGNOSIS — Z6830 Body mass index (BMI) 30.0-30.9, adult: Secondary | ICD-10-CM | POA: Diagnosis not present

## 2023-07-02 MED ORDER — FLUCONAZOLE 150 MG PO TABS: 150.0000 mg | ORAL_TABLET | Freq: Once | ORAL | 0 refills | Status: DC

## 2023-07-02 NOTE — Assessment & Plan Note (Signed)
Encourage diet and exercise for weight loss 

## 2023-07-02 NOTE — Progress Notes (Signed)
Subjective:    Patient ID: Melinda Roach, female    DOB: 1971/04/28, 52 y.o.   MRN: [id number]  HPI  Patient presents to clinic today for 2-month follow-up of chronic conditions.  DM2: Her last A1c was 7%.  She is not taking any oral diabetic medication at this time.  She does not check her sugars.  She checks her feet routinely.  Her last eye exam was >1-year ago.  Flu never.  Pneumovax never.  COVID x 2.  HLD: Her last LDL was 105, triglycerides 242, 03/2023.  She is not taking any cholesterol-lowering medication at this time.  She does not consume a low-fat diet.  Review of Systems     Past Medical History:  Diagnosis Date   Depression    History of chicken pox     Current Outpatient Medications  Medication Sig Dispense Refill   Drospirenone (SLYND) 4 MG TABS Take 1 tablet (4 mg total) by mouth daily. 84 tablet 4   fluconazole (DIFLUCAN) 150 MG tablet Take 1 tablet (150 mg total) by mouth once for 1 dose. 1 tablet 0   No current facility-administered medications for this visit.    Allergies  Allergen Reactions   Penicillins Rash    Family History  Problem Relation Age of Onset   Diabetes Mother    Hyperlipidemia Mother    Lung cancer Mother    COPD Father    Depression Daughter     Social History   Socioeconomic History   Marital status: Divorced    Spouse name: Not on file   Number of children: Not on file   Years of education: Not on file   Highest education level: Associate degree: academic program  Occupational History   Not on file  Tobacco Use   Smoking status: Never   Smokeless tobacco: Never  Vaping Use   Vaping status: Never Used  Substance and Sexual Activity   Alcohol use: Yes    Comment: occasional   Drug use: No   Sexual activity: Yes    Birth control/protection: None  Other Topics Concern   Not on file  Social History Narrative   Not on file   Social Determinants of Health   Financial Resource Strain: Low Risk  (07/01/2023)    Overall Financial Resource Strain (CARDIA)    Difficulty of Paying Living Expenses: Not hard at all  Food Insecurity: No Food Insecurity (07/01/2023)   Hunger Vital Sign    Worried About Running Out of Food in the Last Year: Never true    Ran Out of Food in the Last Year: Never true  Transportation Needs: No Transportation Needs (07/01/2023)   PRAPARE - Administrator, Civil Service (Medical): No    Lack of Transportation (Non-Medical): No  Physical Activity: Insufficiently Active (07/01/2023)   Exercise Vital Sign    Days of Exercise per Week: 2 days    Minutes of Exercise per Session: 10 min  Stress: No Stress Concern Present (07/01/2023)   [employer] of Occupational Health - Occupational Stress Questionnaire    Feeling of Stress : Only a little  Social Connections: Moderately Isolated (07/01/2023)   Social Connection and Isolation Panel [NHANES]    Frequency of Communication with Friends and Family: More than three times a week    Frequency of Social Gatherings with Friends and Family: More than three times a week    Attends Religious Services: 1 to 4 times per year    Active Member  of Clubs or Organizations: No    Attends Banker Meetings: Not on file    Marital Status: Divorced  Intimate Partner Violence: Unknown (11/19/2017)   Humiliation, Afraid, Rape, and Kick questionnaire    Fear of Current or Ex-Partner: Patient declined    Emotionally Abused: Patient declined    Physically Abused: Patient declined    Sexually Abused: Patient declined     Constitutional: Denies fever, malaise, fatigue, headache or abrupt weight changes.  HEENT: Denies eye pain, eye redness, ear pain, ringing in the ears, wax buildup, runny nose, nasal congestion, bloody nose, or sore throat. Respiratory: Denies difficulty breathing, shortness of breath, cough or sputum production.   Cardiovascular: Denies chest pain, chest tightness, palpitations or swelling in the hands or  feet.  Gastrointestinal: Denies abdominal pain, bloating, constipation, diarrhea or blood in the stool.  GU: Denies urgency, frequency, pain with urination, burning sensation, blood in urine, odor or discharge. Musculoskeletal: Denies decrease in range of motion, difficulty with gait, muscle pain or joint pain and swelling.  Skin: Denies redness, rashes, lesions or ulcercations.  Neurological: Denies dizziness, difficulty with memory, difficulty with speech or problems with balance and coordination.  Psych: Patient has a history of depression.  Denies anxiety, SI/HI.  No other specific complaints in a complete review of systems (except as listed in HPI above).  Objective:   Physical Exam   BP 120/66 (BP Location: Left Arm, Patient Position: Sitting, Cuff Size: Normal)   Pulse 74   Temp (!) 96.8 F (36 C) (Temporal)   Wt 189 lb (85.7 kg)   SpO2 98%   BMI 30.97 kg/m   Wt Readings from Last 3 Encounters:  06/25/23 188 lb (85.3 kg)  04/06/23 203 lb 9.6 oz (92.4 kg)  04/02/23 205 lb (93 kg)    General: Appears their stated age, obese, in NAD. Skin: Warm, dry and intact. No ulcerations noted. Cardiovascular: Normal rate. Pulmonary/Chest: Normal effort and positive vesicular breath sounds. Musculoskeletal:  No difficulty with gait.  Neurological: Alert and oriented.    BMET    Component Value Date/Time   NA 139 04/02/2023 1434   K 4.1 04/02/2023 1434   CL 104 04/02/2023 1434   CO2 24 04/02/2023 1434   GLUCOSE 146 (H) 04/02/2023 1434   BUN 13 04/02/2023 1434   CREATININE 0.94 04/02/2023 1434   CALCIUM 9.1 04/02/2023 1434   GFRNONAA >89 11/17/2016 1147   GFRAA >89 11/17/2016 1147    Lipid Panel     Component Value Date/Time   CHOL 176 04/02/2023 1434   TRIG 242 (H) 04/02/2023 1434   HDL 34 (L) 04/02/2023 1434   CHOLHDL 5.2 (H) 04/02/2023 1434   VLDL 28.8 01/10/2021 1108   LDLCALC 105 (H) 04/02/2023 1434    CBC    Component Value Date/Time   WBC 8.7 04/02/2023  1434   RBC 4.34 04/02/2023 1434   HGB 14.2 04/02/2023 1434   HCT 41.8 04/02/2023 1434   PLT 297 04/02/2023 1434   MCV 96.3 04/02/2023 1434   MCH 32.7 04/02/2023 1434   MCHC 34.0 04/02/2023 1434   RDW 12.9 04/02/2023 1434   LYMPHSABS 2,646 11/17/2016 1147   MONOABS 490 11/17/2016 1147   EOSABS 98 11/17/2016 1147   BASOSABS 0 11/17/2016 1147    Hgb A1C Lab Results  Component Value Date   HGBA1C 7.0 (H) 04/02/2023           Assessment & Plan:      RTC in 6  months, follow-up chronic conditions Nicki Reaper, NP

## 2023-07-02 NOTE — Assessment & Plan Note (Signed)
 Lipid profile today Encouraged her to consume low-fat diet

## 2023-07-02 NOTE — Assessment & Plan Note (Signed)
POCT A1c 6.1% We will check urine micro albumin Encourage low-carb diet and exercise for weight loss Encourage routine eye exam Encouraged routine foot exam She declines immunizations

## 2023-07-03 LAB — LIPID PANEL
Cholesterol: 151 mg/dL (ref <200)
HDL: 34 mg/dL — ABNORMAL LOW (ref >=50)
LDL Cholesterol (Calc): 92 mg/dL (calc)
Non-HDL Cholesterol (Calc): 117 mg/dL (calc) (ref <130)
Total CHOL/HDL Ratio: 4.4 (calc) (ref <5.0)
Triglycerides: 155 mg/dL — ABNORMAL HIGH (ref <150)

## 2023-07-03 LAB — MICROALBUMIN / CREATININE URINE RATIO
Creatinine, Urine: 175 mg/dL (ref 20–275)
Microalb Creat Ratio: 5 mg/g creat (ref <30)
Microalb, Ur: 0.9 mg/dL

## 2023-07-08 ENCOUNTER — Encounter: Payer: Self-pay | Admitting: Internal Medicine

## 2023-07-25 ENCOUNTER — Ambulatory Visit
Admission: RE | Admit: 2023-07-25 | Discharge: 2023-07-25 | Disposition: A | Discharge status: Home / Self Care | Payer: Medicaid Other | Source: Ambulatory Visit | Attending: Certified Nurse Midwife | Admitting: Certified Nurse Midwife

## 2023-07-25 DIAGNOSIS — Z1231 Encounter for screening mammogram for malignant neoplasm of breast: Secondary | ICD-10-CM | POA: Insufficient documentation

## 2023-09-04 ENCOUNTER — Encounter: Payer: Self-pay | Admitting: Internal Medicine

## 2023-09-04 ENCOUNTER — Ambulatory Visit: Payer: Medicaid Other | Admitting: Internal Medicine

## 2023-09-04 VITALS — BP 108/60 | HR 84 | Temp 96.6°F | Wt 177.0 lb

## 2023-09-04 DIAGNOSIS — Z20828 Contact with and (suspected) exposure to other viral communicable diseases: Secondary | ICD-10-CM

## 2023-09-04 NOTE — Patient Instructions (Signed)
Herpes Simplex Test Why am I having this test? The herpes simplex test is used to check for an infection with the herpes simplex virus (HSV). There are two common types of HSV: Type 1 (HSV1) primarily causes cold sores on or around the mouth and sometimes on or around the eyes. Type 2 (HSV2) is typically sexually transmitted and causes sores in and around the genitals. Both Type 1 and Type 2 can affect either the mouth and eyes or genital areas. You may need to have an HSV test if: Your health care provider thinks that you may have an HSV infection. You have a weakened body defense system (immune system) and you have sores around your mouth or genitals that look like HSV eruptions. You have sex with multiple partners, or your partner has genital herpes. You are pregnant, have herpes, and are expecting to deliver a baby vaginally in the next 6-8 weeks. What is being tested? There are two types of herpes simplex tests: Blood test. This test checks the sample for: HSV antibodies. Antibodies are proteins that your body makes to help fight infection. This test checks whether antibodies against HSV are in your blood. HSV antigens. This checks for the presence of the HSV virus (antigen) in your blood. Culture test. This test checks for the virus in a sample of fluid from an open sore. Culture tests take several days to complete. What kind of sample is taken?     Samples will be collected according to the type of tests your health care provider orders. For the blood tests, a blood sample is usually collected by inserting a needle into a blood vessel. For a culture test, the sample is usually collected by swabbing the fluid that is coming from an open sore during an active infection (outbreak). How are the results reported? Your test results will be reported as either positive or negative. For this test, normal results are: Negative for HSV virus or antibodies in your blood. Negative for HSV virus  in cultured fluid. What do the results mean? A positive result may indicate that you have an active HSV infection. The presence of HSV1 or HSV2 antigens or antibodies in your blood may mean that you have an active HSV infection. A negative result means that you do not have HSV1 or HSV2 virus in your blood. This may mean that you do not have an HSV infection. Talk with your health care provider about what your results mean. In some cases, your health care provider may do more testing to confirm the results. Questions to ask your health care provider Ask your health care provider, or the department that is doing the test: When will my results be ready? How will I get my results? What are my treatment options? What other tests do I need? What are my next steps? Summary You may have this test if your health care provider suspects that you have a herpes simplex virus (HSV) infection. Type 1 (HSV1) primarily causes cold sores on or around the mouth and sometimes on or around the eyes. Type 2 (HSV2) is typically sexually transmitted and causes sores in and around the genitals. The test may be done using a blood sample or a sample of fluid from an open sore. A positive result may mean that you have an active HSV infection. A negative result means that you probably do not have an active infection. Talk with your health care provider about what your results mean. This information is not intended  to replace advice given to you by your health care provider. Make sure you discuss any questions you have with your health care provider. Document Revised: 10/17/2021 Document Reviewed: 10/17/2021 Elsevier Patient Education  2024 [employer].

## 2023-09-04 NOTE — Progress Notes (Signed)
Subjective:    Patient ID: Melinda Roach, female    DOB: 1971/06/25, 52 y.o.   MRN: [id number]  HPI  Patient presents to the clinic today requesting to be tested for herpes.  She reports her partner has been diagnosed with this.  She has never had any outbreaks that she is aware of.  Review of Systems     Past Medical History:  Diagnosis Date   Depression    History of chicken pox     Current Outpatient Medications  Medication Sig Dispense Refill   Drospirenone (SLYND) 4 MG TABS Take 1 tablet (4 mg total) by mouth daily. 84 tablet 4   No current facility-administered medications for this visit.    Allergies  Allergen Reactions   Penicillins Rash    Family History  Problem Relation Age of Onset   Diabetes Mother    Hyperlipidemia Mother    Lung cancer Mother    COPD Father    Depression Daughter     Social History   Socioeconomic History   Marital status: Divorced    Spouse name: Not on file   Number of children: Not on file   Years of education: Not on file   Highest education level: Associate degree: academic program  Occupational History   Not on file  Tobacco Use   Smoking status: Never   Smokeless tobacco: Never  Vaping Use   Vaping status: Never Used  Substance and Sexual Activity   Alcohol use: Yes    Comment: occasional   Drug use: No   Sexual activity: Yes    Birth control/protection: None  Other Topics Concern   Not on file  Social History Narrative   Not on file   Social Determinants of Health   Financial Resource Strain: Low Risk  (07/01/2023)   Overall Financial Resource Strain (CARDIA)    Difficulty of Paying Living Expenses: Not hard at all  Food Insecurity: No Food Insecurity (07/01/2023)   Hunger Vital Sign    Worried About Running Out of Food in the Last Year: Never true    Ran Out of Food in the Last Year: Never true  Transportation Needs: No Transportation Needs (07/01/2023)   PRAPARE - Scientist, research (physical sciences) (Medical): No    Lack of Transportation (Non-Medical): No  Physical Activity: Insufficiently Active (07/01/2023)   Exercise Vital Sign    Days of Exercise per Week: 2 days    Minutes of Exercise per Session: 10 min  Stress: No Stress Concern Present (07/01/2023)   [employer] of Occupational Health - Occupational Stress Questionnaire    Feeling of Stress : Only a little  Social Connections: Moderately Isolated (07/01/2023)   Social Connection and Isolation Panel [NHANES]    Frequency of Communication with Friends and Family: More than three times a week    Frequency of Social Gatherings with Friends and Family: More than three times a week    Attends Religious Services: 1 to 4 times per year    Active Member of [employer] or Organizations: No    Attends Banker Meetings: Not on file    Marital Status: Divorced  Intimate Partner Violence: Unknown (11/19/2017)   Humiliation, Afraid, Rape, and Kick questionnaire    Fear of Current or Ex-Partner: Patient declined    Emotionally Abused: Patient declined    Physically Abused: Patient declined    Sexually Abused: Patient declined     Constitutional: Denies fever, malaise, fatigue, headache or  abrupt weight changes.  HEENT: Denies eye pain, eye redness, ear pain, ringing in the ears, wax buildup, runny nose, nasal congestion, bloody nose, or sore throat. Respiratory: Denies difficulty breathing, shortness of breath, cough or sputum production.   Cardiovascular: Denies chest pain, chest tightness, palpitations or swelling in the hands or feet.  Gastrointestinal: Denies abdominal pain, bloating, constipation, diarrhea or blood in the stool.  GU: Denies urgency, frequency, pain with urination, burning sensation, blood in urine, odor or discharge. Musculoskeletal: Denies decrease in range of motion, difficulty with gait, muscle pain or joint pain and swelling.  Skin: Denies redness, rashes, lesions or ulcercations.   Neurological: Denies dizziness, difficulty with memory, difficulty with speech or problems with balance and coordination.  Psych: Patient has a history of depression.  Denies anxiety, SI/HI.  No other specific complaints in a complete review of systems (except as listed in HPI above).  Objective:   Physical Exam  BP 108/60 (BP Location: Right Arm, Patient Position: Sitting, Cuff Size: Normal)   Pulse 84   Temp (!) 96.6 F (35.9 C) (Temporal)   Wt 177 lb (80.3 kg)   SpO2 100%   BMI 29.01 kg/m   Wt Readings from Last 3 Encounters:  07/02/23 189 lb (85.7 kg)  06/25/23 188 lb (85.3 kg)  04/06/23 203 lb 9.6 oz (92.4 kg)    General: Appears her stated age, oveweight, in NAD. Skin: Warm, dry and intact. No rashes, lesions or ulcerations noted. Cardiovascular: Normal rate and rhythm.  Pulmonary/Chest: Normal effort and positive vesicular breath sounds. No respiratory distress. No wheezes, rales or ronchi noted.  Neurological: Alert and oriented.  BMET    Component Value Date/Time   NA 139 04/02/2023 1434   K 4.1 04/02/2023 1434   CL 104 04/02/2023 1434   CO2 24 04/02/2023 1434   GLUCOSE 146 (H) 04/02/2023 1434   BUN 13 04/02/2023 1434   CREATININE 0.94 04/02/2023 1434   CALCIUM 9.1 04/02/2023 1434   GFRNONAA >89 11/17/2016 1147   GFRAA >89 11/17/2016 1147    Lipid Panel     Component Value Date/Time   CHOL 151 07/02/2023 1604   TRIG 155 (H) 07/02/2023 1604   HDL 34 (L) 07/02/2023 1604   CHOLHDL 4.4 07/02/2023 1604   VLDL 28.8 01/10/2021 1108   LDLCALC 92 07/02/2023 1604    CBC    Component Value Date/Time   WBC 8.7 04/02/2023 1434   RBC 4.34 04/02/2023 1434   HGB 14.2 04/02/2023 1434   HCT 41.8 04/02/2023 1434   PLT 297 04/02/2023 1434   MCV 96.3 04/02/2023 1434   MCH 32.7 04/02/2023 1434   MCHC 34.0 04/02/2023 1434   RDW 12.9 04/02/2023 1434   LYMPHSABS 2,646 11/17/2016 1147   MONOABS 490 11/17/2016 1147   EOSABS 98 11/17/2016 1147   BASOSABS 0  11/17/2016 1147    Hgb A1C Lab Results  Component Value Date   HGBA1C 7.0 (H) 04/02/2023            Assessment & Plan:   Exposure to herpes simplex virus:  Will check HSV 1 and 2 IgG Discussed how this is spread  RTC in 4 months, follow-up chronic conditions Nicki Reaper, NP

## 2023-09-23 ENCOUNTER — Encounter: Payer: Self-pay | Admitting: Internal Medicine

## 2023-10-02 ENCOUNTER — Ambulatory Visit: Payer: Medicaid Other | Admitting: Internal Medicine

## 2023-10-22 DIAGNOSIS — R208 Other disturbances of skin sensation: Secondary | ICD-10-CM | POA: Diagnosis not present

## 2023-10-22 DIAGNOSIS — R238 Other skin changes: Secondary | ICD-10-CM | POA: Diagnosis not present

## 2023-10-22 DIAGNOSIS — B078 Other viral warts: Secondary | ICD-10-CM | POA: Diagnosis not present

## 2023-10-22 DIAGNOSIS — L718 Other rosacea: Secondary | ICD-10-CM | POA: Diagnosis not present

## 2023-10-22 DIAGNOSIS — L538 Other specified erythematous conditions: Secondary | ICD-10-CM | POA: Diagnosis not present

## 2023-10-22 DIAGNOSIS — L570 Actinic keratosis: Secondary | ICD-10-CM | POA: Diagnosis not present

## 2023-11-26 LAB — HM DIABETES EYE EXAM

## 2023-11-28 DIAGNOSIS — H5213 Myopia, bilateral: Secondary | ICD-10-CM | POA: Diagnosis not present

## 2023-12-10 ENCOUNTER — Encounter: Payer: Self-pay | Admitting: Internal Medicine

## 2023-12-10 ENCOUNTER — Ambulatory Visit: Payer: Medicaid Other | Admitting: Internal Medicine

## 2023-12-10 VITALS — BP 112/68 | Ht 65.5 in | Wt 188.2 lb

## 2023-12-10 DIAGNOSIS — B369 Superficial mycosis, unspecified: Secondary | ICD-10-CM | POA: Diagnosis not present

## 2023-12-10 MED ORDER — SELENIUM SULFIDE 2.25 % EX SHAM: 1.0000 | MEDICATED_SHAMPOO | Freq: Every day | CUTANEOUS | 0 refills | Status: AC

## 2023-12-10 NOTE — Progress Notes (Signed)
Subjective:    Patient ID: Melinda Roach, female    DOB: April 05, 1971, 52 y.o.   MRN: [id number]  HPI  Discussed the use of AI scribe software for clinical note transcription with the patient, who gave verbal consent to proceed.  The patient presents with a non-pruritic, non-painful rash that has been present for approximately a month. The rash, described as 'splotches,' has spread to the chest area. The patient noticed the rash after starting to use a 'weed pen,' leading to a suspicion of an allergic reaction to the synthetic substances in the pen. However, discontinuation of the pen use did not result in complete resolution of the rash, although some lightening was noted. The rash's appearance varies, becoming darker or lighter depending on the day. The patient also reports acne on the back, but it is unclear if the rash has spread to this area. The patient's daughter has commented on an apparent worsening of the back acne. The patient denies any new animal exposure.    Review of Systems   Past Medical History:  Diagnosis Date   Depression    History of chicken pox     Current Outpatient Medications  Medication Sig Dispense Refill   Drospirenone (SLYND) 4 MG TABS Take 1 tablet (4 mg total) by mouth daily. 84 tablet 4   No current facility-administered medications for this visit.    Allergies  Allergen Reactions   Penicillins Rash    Family History  Problem Relation Age of Onset   Diabetes Mother    Hyperlipidemia Mother    Lung cancer Mother    COPD Father    Depression Daughter     Social History   Socioeconomic History   Marital status: Divorced    Spouse name: Not on file   Number of children: Not on file   Years of education: Not on file   Highest education level: Associate degree: academic program  Occupational History   Not on file  Tobacco Use   Smoking status: Never   Smokeless tobacco: Never  Vaping Use   Vaping status: Never Used  Substance and  Sexual Activity   Alcohol use: Yes    Comment: occasional   Drug use: No   Sexual activity: Yes    Birth control/protection: None  Other Topics Concern   Not on file  Social History Narrative   Not on file   Social Drivers of Health   Financial Resource Strain: Low Risk  (07/01/2023)   Overall Financial Resource Strain (CARDIA)    Difficulty of Paying Living Expenses: Not hard at all  Food Insecurity: No Food Insecurity (07/01/2023)   Hunger Vital Sign    Worried About Running Out of Food in the Last Year: Never true    Ran Out of Food in the Last Year: Never true  Transportation Needs: No Transportation Needs (07/01/2023)   PRAPARE - Administrator, Civil Service (Medical): No    Lack of Transportation (Non-Medical): No  Physical Activity: Insufficiently Active (07/01/2023)   Exercise Vital Sign    Days of Exercise per Week: 2 days    Minutes of Exercise per Session: 10 min  Stress: No Stress Concern Present (07/01/2023)   [employer] of Occupational Health - Occupational Stress Questionnaire    Feeling of Stress : Only a little  Social Connections: Moderately Isolated (07/01/2023)   Social Connection and Isolation Panel [NHANES]    Frequency of Communication with Friends and Family: More than three times a  week    Frequency of Social Gatherings with Friends and Family: More than three times a week    Attends Religious Services: 1 to 4 times per year    Active Member of [employer] or Organizations: No    Attends Engineer, structural: Not on file    Marital Status: Divorced  Intimate Partner Violence: Unknown (11/19/2017)   Humiliation, Afraid, Rape, and Kick questionnaire    Fear of Current or Ex-Partner: Patient declined    Emotionally Abused: Patient declined    Physically Abused: Patient declined    Sexually Abused: Patient declined     Constitutional: Denies fever, malaise, fatigue, headache or abrupt weight changes.  HEENT: Denies eye pain, eye  redness, ear pain, ringing in the ears, wax buildup, runny nose, nasal congestion, bloody nose, or sore throat. Respiratory: Denies difficulty breathing, shortness of breath, cough or sputum production.   Cardiovascular: Denies chest pain, chest tightness, palpitations or swelling in the hands or feet.  Skin: Patient reports rash.  Denies ulcercations.    No other specific complaints in a complete review of systems (except as listed in HPI above).      Objective:   Physical Exam  BP 112/68 (BP Location: Right Arm, Patient Position: Sitting)   Ht 5' 5.5" (1.664 m)   Wt 188 lb 3.2 oz (85.4 kg)   BMI 30.84 kg/m   Wt Readings from Last 3 Encounters:  09/04/23 177 lb (80.3 kg)  07/02/23 189 lb (85.7 kg)  06/25/23 188 lb (85.3 kg)    General: Appears her stated age, obese, in NAD. Skin: Scattered, annular, scaly lesions noted of the forearms, back and abdomen.  Acne noted on the upper back.  Cardiovascular: Normal rate and rhythm.  Pulmonary/Chest: Normal effort and positive vesicular breath sounds. No respiratory distress. No wheezes, rales or ronchi noted.  Musculoskeletal: No difficulty with gait.  Neurological: Alert and oriented.     BMET    Component Value Date/Time   NA 139 04/02/2023 1434   K 4.1 04/02/2023 1434   CL 104 04/02/2023 1434   CO2 24 04/02/2023 1434   GLUCOSE 146 (H) 04/02/2023 1434   BUN 13 04/02/2023 1434   CREATININE 0.94 04/02/2023 1434   CALCIUM 9.1 04/02/2023 1434   GFRNONAA >89 11/17/2016 1147   GFRAA >89 11/17/2016 1147    Lipid Panel     Component Value Date/Time   CHOL 151 07/02/2023 1604   TRIG 155 (H) 07/02/2023 1604   HDL 34 (L) 07/02/2023 1604   CHOLHDL 4.4 07/02/2023 1604   VLDL 28.8 01/10/2021 1108   LDLCALC 92 07/02/2023 1604    CBC    Component Value Date/Time   WBC 8.7 04/02/2023 1434   RBC 4.34 04/02/2023 1434   HGB 14.2 04/02/2023 1434   HCT 41.8 04/02/2023 1434   PLT 297 04/02/2023 1434   MCV 96.3 04/02/2023 1434    MCH 32.7 04/02/2023 1434   MCHC 34.0 04/02/2023 1434   RDW 12.9 04/02/2023 1434   LYMPHSABS 2,646 11/17/2016 1147   MONOABS 490 11/17/2016 1147   EOSABS 98 11/17/2016 1147   BASOSABS 0 11/17/2016 1147    Hgb A1C Lab Results  Component Value Date   HGBA1C 7.0 (H) 04/02/2023            Assessment & Plan:  Assessment and Plan    Rash Persistent, non-pruritic, non-painful rash present for at least a month. Suspected fungal etiology, possibly tinea versicolor. No new exposures or triggers identified. -Start  Selenium Sulfide shampoo, apply to affected areas, leave on for 10 minutes, then rinse thoroughly. Repeat daily for 7 days. -If no improvement after 7 days, patient to notify the office for further management and will consider prednisone taper x 7 days.        RTC in 1 month for follow-up of chronic conditions Nicki Reaper, NP

## 2023-12-10 NOTE — Patient Instructions (Signed)
 Tinea Versicolor  Tinea versicolor is a skin infection. It is caused by a type of yeast. It is normal for some yeast to be on your skin, but too much yeast causes this infection. The infection causes a rash of light or dark patches on your skin. The rash is most common on the chest, back, neck, or upper arms. The infection usually does not cause other problems. If it is treated, it will probably go away in a few weeks. The infection cannot be spread from one person to another (is notcontagious). What are the causes? This condition is caused by a certain type of yeast that starts to grow too much on your skin. What increases the risk? Heat and humidity. Sweating too much. Hormone changes. This may happen when taking birth control pills. Oily skin. A weak disease-fighting system (immunesystem). What are the signs or symptoms? A rash of light or dark patches on your skin. The rash may have: Patches of tan or pink spots (on light skin). Patches of white or brown spots (on dark skin). Patches of skin that do not tan. Well-marked edges. Scales. Mild itching. There may also be no itching. How is this treated? Treatment for this condition may include: Dandruff shampoo. The shampoo may be used on the affected skin during showers or baths. Over-the-counter medicated skin cream, lotion, or soaps. Prescription antifungal medicine. This may include cream or pills. Medicine to help your itching. Follow these instructions at home: Use over-the-counter and prescription medicines only as told by your doctor. Wash your skin with dandruff shampoo as told by your doctor. Do not scratch your skin in the rash area. Avoid places that are hot and humid. Do not use tanning booths. Try to avoid sweating a lot. Contact a doctor if: Your symptoms get worse. You have a fever. You have signs of infection such as: Redness, swelling, or pain in the rash area. Warmth coming from your rash. Fluid or blood  coming from your rash. Pus or a bad smell coming from your rash. Your rash comes back (recurs) after treatment. Your rash does not improve with treatment. Your rash spreads to other parts of the body. Summary Tinea versicolor is a skin infection. It causes a rash of light or dark patches on your skin. The rash is most common on the chest, back, neck, or upper arms. This infection usually does not cause other problems. Use over-the-counter and prescription medicines only as told by your doctor. If the infection is treated, it will probably go away in a few weeks. This information is not intended to replace advice given to you by your health care provider. Make sure you discuss any questions you have with your health care provider. Document Revised: 02/22/2021 Document Reviewed: 02/22/2021 Elsevier Patient Education  2024 [employer].

## 2023-12-20 ENCOUNTER — Ambulatory Visit: Payer: Medicaid Other | Admitting: Internal Medicine

## 2023-12-24 MED ORDER — PREDNISONE 10 MG PO TABS: ORAL_TABLET | ORAL | 0 refills | Status: DC

## 2024-01-03 ENCOUNTER — Encounter: Payer: Self-pay | Admitting: Internal Medicine

## 2024-01-03 ENCOUNTER — Ambulatory Visit: Payer: Medicaid Other | Admitting: Internal Medicine

## 2024-01-03 VITALS — BP 114/72 | Ht 65.5 in | Wt 190.4 lb

## 2024-01-03 DIAGNOSIS — E1165 Type 2 diabetes mellitus with hyperglycemia: Secondary | ICD-10-CM | POA: Diagnosis not present

## 2024-01-03 DIAGNOSIS — E785 Hyperlipidemia, unspecified: Secondary | ICD-10-CM

## 2024-01-03 DIAGNOSIS — E6609 Other obesity due to excess calories: Secondary | ICD-10-CM

## 2024-01-03 DIAGNOSIS — F339 Major depressive disorder, recurrent, unspecified: Secondary | ICD-10-CM

## 2024-01-03 DIAGNOSIS — E66811 Obesity, class 1: Secondary | ICD-10-CM

## 2024-01-03 DIAGNOSIS — Z6831 Body mass index (BMI) 31.0-31.9, adult: Secondary | ICD-10-CM

## 2024-01-03 DIAGNOSIS — E1169 Type 2 diabetes mellitus with other specified complication: Secondary | ICD-10-CM | POA: Diagnosis not present

## 2024-01-03 LAB — POCT GLYCOSYLATED HEMOGLOBIN (HGB A1C): Hemoglobin A1C: 6.7 % — AB (ref 4.0–5.6)

## 2024-01-03 MED ORDER — FLUCONAZOLE 100 MG PO TABS: 100.0000 mg | ORAL_TABLET | Freq: Every day | ORAL | 0 refills | Status: DC

## 2024-01-03 NOTE — Progress Notes (Addendum)
Subjective:    Patient ID: Melinda Roach, female    DOB: [birth year], 53 y.o.   MRN: [id number]  HPI  Patient presents to clinic today for follow-up of chronic conditions.  DM2: Her last A1c was 7%, 03/2023.  She is not taking any oral diabetic medication at this time.  She does not check her sugars.  She checks her feet routinely.  Her last eye exam was 11/2023.  Flu never.  Pneumovax never.  COVID x 2.  HLD: Her last LDL was 92, triglycerides 782, 03/2023.  She is not taking any cholesterol-lowering medication at this time.  She tries to consume a low-fat diet.  Depression: She is not currently taking any medications for this.  She is not currently seeing a therapist.  She denies anxiety, SI/HI.  Review of Systems     Past Medical History:  Diagnosis Date   Depression    History of chicken pox     Current Outpatient Medications  Medication Sig Dispense Refill   Drospirenone (SLYND) 4 MG TABS Take 1 tablet (4 mg total) by mouth daily. 84 tablet 4   predniSONE (DELTASONE) 10 MG tablet Take 6 tabs on day 1, 5 tabs on day 2, 4 tabs on day 3, 3 tabs on day 4, 2 tabs on day 5, 1 tab on day 6 21 tablet 0   No current facility-administered medications for this visit.    Allergies  Allergen Reactions   Penicillins Rash    Family History  Problem Relation Age of Onset   Diabetes Mother    Hyperlipidemia Mother    Lung cancer Mother    COPD Father    Depression Daughter     Social History   Socioeconomic History   Marital status: Divorced    Spouse name: Not on file   Number of children: Not on file   Years of education: Not on file   Highest education level: Associate degree: academic program  Occupational History   Not on file  Tobacco Use   Smoking status: Never   Smokeless tobacco: Never  Vaping Use   Vaping status: Never Used  Substance and Sexual Activity   Alcohol use: Yes    Comment: occasional   Drug use: No   Sexual activity: Yes    Birth  control/protection: None  Other Topics Concern   Not on file  Social History Narrative   Not on file   Social Drivers of Health   Financial Resource Strain: Low Risk  (07/01/2023)   Overall Financial Resource Strain (CARDIA)    Difficulty of Paying Living Expenses: Not hard at all  Food Insecurity: No Food Insecurity (07/01/2023)   Hunger Vital Sign    Worried About Running Out of Food in the Last Year: Never true    Ran Out of Food in the Last Year: Never true  Transportation Needs: No Transportation Needs (07/01/2023)   PRAPARE - Administrator, Civil Service (Medical): No    Lack of Transportation (Non-Medical): No  Physical Activity: Insufficiently Active (07/01/2023)   Exercise Vital Sign    Days of Exercise per Week: 2 days    Minutes of Exercise per Session: 10 min  Stress: No Stress Concern Present (07/01/2023)   [employer] of Occupational Health - Occupational Stress Questionnaire    Feeling of Stress : Only a little  Social Connections: Moderately Isolated (07/01/2023)   Social Connection and Isolation Panel [NHANES]    Frequency of Communication with Friends  and Family: More than three times a week    Frequency of Social Gatherings with Friends and Family: More than three times a week    Attends Religious Services: 1 to 4 times per year    Active Member of [employer] or Organizations: No    Attends Engineer, structural: Not on file    Marital Status: Divorced  Intimate Partner Violence: Unknown (11/19/2017)   Humiliation, Afraid, Rape, and Kick questionnaire    Fear of Current or Ex-Partner: Patient declined    Emotionally Abused: Patient declined    Physically Abused: Patient declined    Sexually Abused: Patient declined     Constitutional: Denies fever, malaise, fatigue, headache or abrupt weight changes.  HEENT: Denies eye pain, eye redness, ear pain, ringing in the ears, wax buildup, runny nose, nasal congestion, bloody nose, or sore  throat. Respiratory: Denies difficulty breathing, shortness of breath, cough or sputum production.   Cardiovascular: Denies chest pain, chest tightness, palpitations or swelling in the hands or feet.  Gastrointestinal: Denies abdominal pain, bloating, constipation, diarrhea or blood in the stool.  GU: Denies urgency, frequency, pain with urination, burning sensation, blood in urine, odor or discharge. Musculoskeletal: Denies decrease in range of motion, difficulty with gait, muscle pain or joint pain and swelling.  Skin: Pt reports rash. Denies redness, lesions or ulcercations.  Neurological: Pt reports insomnia. Denies dizziness, difficulty with memory, difficulty with speech or problems with balance and coordination.  Psych: Patient has a history of depression.  Denies anxiety, SI/HI.  No other specific complaints in a complete review of systems (except as listed in HPI above).  Objective:   Physical Exam  BP 114/72 (BP Location: Left Arm, Patient Position: Sitting, Cuff Size: Normal)   Ht 5' 5.5" (1.664 m)   Wt 190 lb 6.4 oz (86.4 kg)   BMI 31.20 kg/m    Wt Readings from Last 3 Encounters:  12/10/23 188 lb 3.2 oz (85.4 kg)  09/04/23 177 lb (80.3 kg)  07/02/23 189 lb (85.7 kg)    General: Appears her stated age, obese, in NAD. Skin: Scattered annular scaly lesions noted of trunk and back.  No ulcerations noted. HEENT: Head: normal shape and size; Eyes: sclera white, no icterus, conjunctiva pink, PERRLA and EOMs intact;  Cardiovascular: Normal rate and rhythm. S1,S2 noted.  No murmur, rubs or gallops noted. No JVD or BLE edema. No carotid bruits noted. Pulmonary/Chest: Normal effort and positive vesicular breath sounds. No respiratory distress. No wheezes, rales or ronchi noted.  Musculoskeletal: Strength 5/5 BUE/BLE. No difficulty with gait.  Neurological: Alert and oriented. Coordination normal.  Psychiatric: Mood and affect normal. Behavior is normal. Judgment and thought  content normal.    BMET    Component Value Date/Time   NA 139 04/02/2023 1434   K 4.1 04/02/2023 1434   CL 104 04/02/2023 1434   CO2 24 04/02/2023 1434   GLUCOSE 146 (H) 04/02/2023 1434   BUN 13 04/02/2023 1434   CREATININE 0.94 04/02/2023 1434   CALCIUM 9.1 04/02/2023 1434   GFRNONAA >89 11/17/2016 1147   GFRAA >89 11/17/2016 1147    Lipid Panel     Component Value Date/Time   CHOL 151 07/02/2023 1604   TRIG 155 (H) 07/02/2023 1604   HDL 34 (L) 07/02/2023 1604   CHOLHDL 4.4 07/02/2023 1604   VLDL 28.8 01/10/2021 1108   LDLCALC 92 07/02/2023 1604    CBC    Component Value Date/Time   WBC 8.7 04/02/2023 1434  RBC 4.34 04/02/2023 1434   HGB 14.2 04/02/2023 1434   HCT 41.8 04/02/2023 1434   PLT 297 04/02/2023 1434   MCV 96.3 04/02/2023 1434   MCH 32.7 04/02/2023 1434   MCHC 34.0 04/02/2023 1434   RDW 12.9 04/02/2023 1434   LYMPHSABS 2,646 11/17/2016 1147   MONOABS 490 11/17/2016 1147   EOSABS 98 11/17/2016 1147   BASOSABS 0 11/17/2016 1147    Hgb A1C Lab Results  Component Value Date   HGBA1C 7.0 (H) 04/02/2023           Assessment & Plan:   Fungal rash:  No significant improvement with selenium sulfide 2.5% We will try fluconazole 100 mg daily x 7 days  RTC in 3 months for your annual exam  Nicki Reaper, NP

## 2024-01-03 NOTE — Addendum Note (Signed)
Addended by: Luanna Cole D on: 01/03/2024 03:20 PM   Modules accepted: Orders

## 2024-01-03 NOTE — Patient Instructions (Signed)

## 2024-01-03 NOTE — Assessment & Plan Note (Signed)
Encourage diet and exercise for weight loss 

## 2024-01-03 NOTE — Assessment & Plan Note (Signed)
Not medicated Support offered 

## 2024-01-03 NOTE — Assessment & Plan Note (Signed)
POCT A1c pending Urine microalbumin has been checked within the last year Not medicated Encourage low-carb diet and exercise for weight loss Encourage routine eye exam Encouraged routine foot exam She declines immunizations

## 2024-01-03 NOTE — Assessment & Plan Note (Signed)
 Lipid profile today Encouraged her to consume low-fat diet

## 2024-01-04 ENCOUNTER — Encounter: Payer: Self-pay | Admitting: Internal Medicine

## 2024-01-04 LAB — LIPID PANEL
Cholesterol: 191 mg/dL (ref <200)
HDL: 35 mg/dL — ABNORMAL LOW (ref >=50)
LDL Cholesterol (Calc): 119 mg/dL — ABNORMAL HIGH
Non-HDL Cholesterol (Calc): 156 mg/dL — ABNORMAL HIGH (ref <130)
Total CHOL/HDL Ratio: 5.5 (calc) — ABNORMAL HIGH (ref <5.0)
Triglycerides: 242 mg/dL — ABNORMAL HIGH (ref <150)

## 2024-01-04 LAB — COMPREHENSIVE METABOLIC PANEL
AG Ratio: 1.6 (calc) (ref 1.0–2.5)
ALT: 9 U/L (ref 6–29)
AST: 10 U/L (ref 10–35)
Albumin: 4.4 g/dL (ref 3.6–5.1)
Alkaline phosphatase (APISO): 61 U/L (ref 37–153)
BUN: 12 mg/dL (ref 7–25)
CO2: 33 mmol/L — ABNORMAL HIGH (ref 20–32)
Calcium: 9.1 mg/dL (ref 8.6–10.4)
Chloride: 103 mmol/L (ref 98–110)
Creat: 0.77 mg/dL (ref 0.50–1.03)
Globulin: 2.8 g/dL (ref 1.9–3.7)
Glucose, Bld: 175 mg/dL — ABNORMAL HIGH (ref 65–139)
Potassium: 3.7 mmol/L (ref 3.5–5.3)
Sodium: 138 mmol/L (ref 135–146)
Total Bilirubin: 0.6 mg/dL (ref 0.2–1.2)
Total Protein: 7.2 g/dL (ref 6.1–8.1)

## 2024-01-04 LAB — CBC
HCT: 41.4 % (ref 35.0–45.0)
Hemoglobin: 14 g/dL (ref 11.7–15.5)
MCH: 31.2 pg (ref 27.0–33.0)
MCHC: 33.8 g/dL (ref 32.0–36.0)
MCV: 92.2 fL (ref 80.0–100.0)
MPV: 10.2 fL (ref 7.5–12.5)
Platelets: 240 10*3/uL (ref 140–400)
RBC: 4.49 10*6/uL (ref 3.80–5.10)
RDW: 13.8 % (ref 11.0–15.0)
WBC: 7.7 10*3/uL (ref 3.8–10.8)

## 2024-01-13 ENCOUNTER — Encounter: Payer: Self-pay | Admitting: Internal Medicine

## 2024-01-18 ENCOUNTER — Other Ambulatory Visit: Payer: Self-pay | Admitting: Internal Medicine

## 2024-01-18 NOTE — Telephone Encounter (Signed)
Requested medication (s) are due for refill today- no  Requested medication (s) are on the active medication list -no  Future visit scheduled -yes  Last refill: unknown  Notes to clinic: non delegated Rx  Requested Prescriptions  Pending Prescriptions Disp Refills   predniSONE (DELTASONE) 10 MG tablet [Pharmacy Med Name: PREDNISONE 10 MG TABLET] 21 tablet 0    Sig: Take 6 tabs on day 1, 5 tabs on day 2, 4 tabs on day 3, 3 tabs on day 4, 2 tabs on day 5, 1 tab on day 6     Not Delegated - Endocrinology:  Oral Corticosteroids Failed - 01/18/2024  8:51 AM      Failed - This refill cannot be delegated      Failed - Manual Review: Eye exam for IOP if prolonged treatment      Failed - Glucose (serum) in normal range and within 180 days    Glucose, Bld  Date Value Ref Range Status  01/03/2024 175 (H) 65 - 139 mg/dL Final    Comment:    .        Non-fasting reference interval .          Failed - Bone Mineral Density or Dexa Scan completed in the last 2 years      Passed - K in normal range and within 180 days    Potassium  Date Value Ref Range Status  01/03/2024 3.7 3.5 - 5.3 mmol/L Final         Passed - Na in normal range and within 180 days    Sodium  Date Value Ref Range Status  01/03/2024 138 135 - 146 mmol/L Final         Passed - Last BP in normal range    BP Readings from Last 1 Encounters:  01/03/24 114/72         Passed - Valid encounter within last 6 months    Recent Outpatient Visits           2 weeks ago Depression, recurrent [facility])   [place] [facility] [place], [place], NP   1 month ago Fungal rash of torso   [place] [facility] [place], [place] W, NP   4 months ago Exposure to herpes simplex virus (HSV)   [place] [facility] [place], Salv[place], NP   6 months ago Skin lesion of face   [place] [facility] [place], [place] W, NP   9 months ago Type 2 diabetes mellitus with  hyperglycemia, without long-term current use of insulin [facility])   [place] [facility] [place], Salv[place], NP       Future Appointments             In 2 months Deirdre Evener, MD [place] [facility]   In 2 months [place], Salv[place], NP [facility], [facility]               Requested Prescriptions  Pending Prescriptions Disp Refills   predniSONE (DELTASONE) 10 MG tablet [Pharmacy Med Name: PREDNISONE 10 MG TABLET] 21 tablet 0    Sig: Take 6 tabs on day 1, 5 tabs on day 2, 4 tabs on day 3, 3 tabs on day 4, 2 tabs on day 5, 1 tab on day 6     Not Delegated - Endocrinology:  Oral Corticosteroids Failed - 01/18/2024  8:51 AM  Failed - This refill cannot be delegated      Failed - Manual Review: Eye exam for IOP if prolonged treatment      Failed - Glucose (serum) in normal range and within 180 days    Glucose, Bld  Date Value Ref Range Status  01/03/2024 175 (H) 65 - 139 mg/dL Final    Comment:    .        Non-fasting reference interval .          Failed - Bone Mineral Density or Dexa Scan completed in the last 2 years      Passed - K in normal range and within 180 days    Potassium  Date Value Ref Range Status  01/03/2024 3.7 3.5 - 5.3 mmol/L Final         Passed - Na in normal range and within 180 days    Sodium  Date Value Ref Range Status  01/03/2024 138 135 - 146 mmol/L Final         Passed - Last BP in normal range    BP Readings from Last 1 Encounters:  01/03/24 114/72         Passed - Valid encounter within last 6 months    Recent Outpatient Visits           2 weeks ago Depression, recurrent [facility])   [place] [facility] [place], [place], NP   1 month ago Fungal rash of torso   [facility] [place], [place] W, NP   4 months ago Exposure to herpes simplex virus (HSV)   [place] [facility] [place], Salv[place], NP   6 months ago Skin lesion  of face   [place] [facility] [place], [place] W, NP   9 months ago Type 2 diabetes mellitus with hyperglycemia, without long-term current use of insulin [facility])   [facility] [place], Salv[place], NP       Future Appointments             In 2 months Deirdre Evener, MD [facility]   In 2 months [place], Salv[place], NP [place] [facility]

## 2024-02-08 DIAGNOSIS — B360 Pityriasis versicolor: Secondary | ICD-10-CM | POA: Diagnosis not present

## 2024-02-08 DIAGNOSIS — D2339 Other benign neoplasm of skin of other parts of face: Secondary | ICD-10-CM | POA: Diagnosis not present

## 2024-02-08 DIAGNOSIS — L810 Postinflammatory hyperpigmentation: Secondary | ICD-10-CM | POA: Diagnosis not present

## 2024-02-08 DIAGNOSIS — L718 Other rosacea: Secondary | ICD-10-CM | POA: Diagnosis not present

## 2024-02-21 ENCOUNTER — Encounter: Payer: Self-pay | Admitting: Internal Medicine

## 2024-02-21 ENCOUNTER — Ambulatory Visit: Admitting: Internal Medicine

## 2024-02-21 VITALS — BP 122/68 | HR 122 | Temp 99.9°F | Ht 65.5 in | Wt 190.0 lb

## 2024-02-21 DIAGNOSIS — J039 Acute tonsillitis, unspecified: Secondary | ICD-10-CM

## 2024-02-21 LAB — POC COVID19/FLU A&B COMBO
Covid Antigen, POC: NEGATIVE
Influenza A Antigen, POC: NEGATIVE
Influenza B Antigen, POC: NEGATIVE

## 2024-02-21 LAB — POCT RAPID STREP A (OFFICE): Rapid Strep A Screen: NEGATIVE

## 2024-02-21 MED ORDER — AMOXICILLIN-POT CLAVULANATE 875-125 MG PO TABS: 1.0000 | ORAL_TABLET | Freq: Two times a day (BID) | ORAL | 0 refills | Status: DC

## 2024-02-21 NOTE — Patient Instructions (Signed)
 Tonsillitis  Tonsillitis is an infection of the throat. Tonsils are tissues in the back of your throat. This infection causes the tonsils to become red, tender, and swollen. What are the causes? Tonsillitis is caused by germs (bacteria or a virus). This condition can also occur when pieces of food and bacteria build up around the tonsils. Tonsillitis that is caused by germs can spread from person to person. What are the signs or symptoms? A sore throat. Trouble swallowing. White patches on the tonsils. Swollen tonsils. Fever. Headache. Tiredness. Not feeling hungry. Snoring during sleep when you did not snore before. Foul-smelling, yellowish-white pieces of material that you cough up or spit out. These can cause bad breath. How is this treated? Medicines. These can be given to treat pain, swelling, or fever. They can also be given to kill bacteria. Surgery to take out the tonsils. This is done if you have very bad infections that do not go away. Follow these instructions at home: Medicines Take over-the-counter and prescription medicines only as told by your doctor. If you were prescribed an antibiotic medicine, take it as told by your doctor. Do not stop taking the antibiotic even if you start to feel better. Eating and drinking Drink enough fluid to keep your pee (urine) pale yellow. While your throat is sore, eat soft or liquid foods, such as: Soup. Sherbet. Soft, warm cereals, such as oatmeal or hot wheat cereal. Drink warm fluids. Eat frozen ice pops. General instructions Rest as much as you can, and get plenty of sleep. Rinse your mouth often with salt water. To make salt water, dissolve -1 tsp (3-6 g) of salt in 1 cup (237 mL) of warm water. Do not swallow the salt water. Wash your hands often with soap and water for at least 20 seconds. If there is no soap and water, use hand sanitizer. Do not share cups, bottles, or other utensils until your symptoms are gone. Do not  smoke or use any products that contain nicotine or tobacco. If you need help quitting, ask your doctor. Keep all follow-up visits. Contact a doctor if: You have large, tender lumps in your neck that are new. You have a fever that does not go away after 2-3 days. You have a rash. You cough up green, yellow-brown, or bloody fluid. You cannot swallow liquids or food for 24 hours. Only one of your tonsils is swollen. Get help right away if: You have any new symptoms such as: Vomiting. Very bad headache. Stiff neck. Chest pain. Trouble breathing or swallowing. You have very bad throat pain, and you also have drooling or voice changes. You have very bad pain that is not helped by medicine. You cannot fully open your mouth. You have redness, swelling, or very bad pain anywhere in your neck. Summary Tonsillitis is an infection of the throat. It causes your tonsils to be red, tender, and swollen. While your throat is sore, eat soft or liquid foods. Rinse your mouth often with salt water. Do not share cups, bottles, or other utensils until your symptoms are gone. This information is not intended to replace advice given to you by your health care provider. Make sure you discuss any questions you have with your health care provider. Document Revised: 04/27/2021 Document Reviewed: 04/28/2021 Elsevier Patient Education  2024 [employer].

## 2024-02-21 NOTE — Progress Notes (Signed)
 Subjective:    Patient ID: Melinda Roach, female    DOB: 22-Apr-1971, 53 y.o.   MRN: [id number]  HPI  Discussed the use of AI scribe software for clinical note transcription with the patient, who gave verbal consent to proceed.   Melinda Roach is a 53 year old female who presents with sore throat and systemic symptoms.  Symptoms began on Tuesday night while she was driving to [place], initially presenting with a sore throat and general malaise. By that night, she developed chills and severe throat pain. No headaches, ear pain, cough,  nausea, vomiting, or diarrhea. She has nasal congestion without rhinorrhea, body aches, and fever.  She has been managing her symptoms with over-the-counter medication every four hours and uses throat lozenges for throat pain. She takes ibuprofen 800 mg every eight hours as needed for fever.  She recently had COVID-19 a few weeks ago, which may be relevant to her current health status. Her household has been experiencing various illnesses, although she has not been around anyone who is sick and is uncertain about the source of her illness.       Review of Systems   Past Medical History:  Diagnosis Date   Depression    History of chicken pox     Current Outpatient Medications  Medication Sig Dispense Refill   Drospirenone (SLYND) 4 MG TABS Take 1 tablet (4 mg total) by mouth daily. 84 tablet 4   fluconazole (DIFLUCAN) 100 MG tablet Take 1 tablet (100 mg total) by mouth daily. 7 tablet 0   Selenium Sulfide 2.25 % SHAM APPLY 1 APPLICATION TOPICALLY DAILY AT 12 NOON FOR 7 DAYS.     No current facility-administered medications for this visit.    Allergies  Allergen Reactions   Penicillins Rash    Family History  Problem Relation Age of Onset   Diabetes Mother    Hyperlipidemia Mother    Lung cancer Mother    COPD Father    Depression Daughter     Social History   Socioeconomic History   Marital status: Divorced    Spouse name: Not on  file   Number of children: Not on file   Years of education: Not on file   Highest education level: Associate degree: academic program  Occupational History   Not on file  Tobacco Use   Smoking status: Never   Smokeless tobacco: Never  Vaping Use   Vaping status: Never Used  Substance and Sexual Activity   Alcohol use: Yes    Comment: occasional   Drug use: No   Sexual activity: Yes    Birth control/protection: None  Other Topics Concern   Not on file  Social History Narrative   Not on file   Social Drivers of Health   Financial Resource Strain: Low Risk  (07/01/2023)   Overall Financial Resource Strain (CARDIA)    Difficulty of Paying Living Expenses: Not hard at all  Food Insecurity: No Food Insecurity (07/01/2023)   Hunger Vital Sign    Worried About Running Out of Food in the Last Year: Never true    Ran Out of Food in the Last Year: Never true  Transportation Needs: No Transportation Needs (07/01/2023)   PRAPARE - Administrator, Civil Service (Medical): No    Lack of Transportation (Non-Medical): No  Physical Activity: Insufficiently Active (07/01/2023)   Exercise Vital Sign    Days of Exercise per Week: 2 days    Minutes of Exercise per  Session: 10 min  Stress: No Stress Concern Present (07/01/2023)   [employer] of Occupational Health - Occupational Stress Questionnaire    Feeling of Stress : Only a little  Social Connections: Moderately Isolated (07/01/2023)   Social Connection and Isolation Panel [NHANES]    Frequency of Communication with Friends and Family: More than three times a week    Frequency of Social Gatherings with Friends and Family: More than three times a week    Attends Religious Services: 1 to 4 times per year    Active Member of [employer] or Organizations: No    Attends Banker Meetings: Not on file    Marital Status: Divorced  Intimate Partner Violence: Unknown (11/19/2017)   Humiliation, Afraid, Rape, and Kick  questionnaire    Fear of Current or Ex-Partner: Patient declined    Emotionally Abused: Patient declined    Physically Abused: Patient declined    Sexually Abused: Patient declined     Constitutional: Patient reports fever and chills.  Denies malaise, fatigue, headache or abrupt weight changes.  HEENT: Patient reports nasal congestion and sore throat.  Denies eye pain, eye redness, ear pain, ringing in the ears, wax buildup, runny nose, bloody nose. Respiratory: Pt reports shortness of breath. Denies difficulty breathing, cough or sputum production.   Cardiovascular: Denies chest pain, chest tightness, palpitations or swelling in the hands or feet.  Gastrointestinal: Denies abdominal pain, bloating, constipation, diarrhea or blood in the stool.  Musculoskeletal: Pt reports body aches. Denies decrease in range of motion, difficulty with gait, or joint swelling.  Neurological: Denies dizziness, difficulty with memory, difficulty with speech or problems with balance and coordination.    No other specific complaints in a complete review of systems (except as listed in HPI above).      Objective:   Physical Exam  BP 122/68 (BP Location: Left Arm, Patient Position: Sitting, Cuff Size: Normal)   Pulse (!) 122   Temp 99.9 F (37.7 C)   Ht 5' 5.5" (1.664 m)   Wt 190 lb (86.2 kg)   SpO2 98%   BMI 31.14 kg/m   Wt Readings from Last 3 Encounters:  01/03/24 190 lb 6.4 oz (86.4 kg)  12/10/23 188 lb 3.2 oz (85.4 kg)  09/04/23 177 lb (80.3 kg)    General: Appears her stated age, appears well but in NAD. Skin: Warm, dry and intact. No rashes noted. HEENT: Head: normal shape and size, no sinus tenderness noted; Eyes: sclera white, no icterus, conjunctiva pink, PERRLA and EOMs intact; Nose: mucosa pink and moist, septum midline; Throat/Mouth: Teeth present, mucosa erythematous and moist, tonsils 2+ with white exudate noted on the right tonsil.  Neck: No adenopathy noted. Cardiovascular:  Tachycardia with normal rhythm. S1,S2 noted.  No murmur, rubs or gallops noted.  Pulmonary/Chest: Normal effort and positive vesicular breath sounds. No respiratory distress. No wheezes, rales or ronchi noted.  Neurological: Alert and oriented.   BMET    Component Value Date/Time   NA 138 01/03/2024 1515   K 3.7 01/03/2024 1515   CL 103 01/03/2024 1515   CO2 33 (H) 01/03/2024 1515   GLUCOSE 175 (H) 01/03/2024 1515   BUN 12 01/03/2024 1515   CREATININE 0.77 01/03/2024 1515   CALCIUM 9.1 01/03/2024 1515   GFRNONAA >89 11/17/2016 1147   GFRAA >89 11/17/2016 1147    Lipid Panel     Component Value Date/Time   CHOL 191 01/03/2024 1515   TRIG 242 (H) 01/03/2024 1515   HDL  35 (L) 01/03/2024 1515   CHOLHDL 5.5 (H) 01/03/2024 1515   VLDL 28.8 01/10/2021 1108   LDLCALC 119 (H) 01/03/2024 1515    CBC    Component Value Date/Time   WBC 7.7 01/03/2024 1515   RBC 4.49 01/03/2024 1515   HGB 14.0 01/03/2024 1515   HCT 41.4 01/03/2024 1515   PLT 240 01/03/2024 1515   MCV 92.2 01/03/2024 1515   MCH 31.2 01/03/2024 1515   MCHC 33.8 01/03/2024 1515   RDW 13.8 01/03/2024 1515   LYMPHSABS 2,646 11/17/2016 1147   MONOABS 490 11/17/2016 1147   EOSABS 98 11/17/2016 1147   BASOSABS 0 11/17/2016 1147    Hgb A1C Lab Results  Component Value Date   HGBA1C 6.7 (A) 01/03/2024            Assessment & Plan:   Assessment and Plan    Presumed Tonsillitis Yellow exudate suggests tonsillitis despite negative strep test.  Rapid COVID/flu negative.   - Prescribe Augmentin 875 mg/125 mg twice daily for 10 days. -She declines steroid injection today - Advise saltwater gargles. - Recommend ibuprofen 800 mg every 8 hours as needed for fever. - Instruct to report if symptoms do not improve.       RTC in 1 month for your annual exam Nicki Reaper, NP

## 2024-02-25 ENCOUNTER — Telehealth: Payer: Self-pay

## 2024-02-25 NOTE — Telephone Encounter (Signed)
 Patient reports she has been on slynd for a years. She never has a period. In the past two months, she has had some random BTB of dark brown blood. She decided to stop the slynd on 01/12/24 and allow her body to have a period to get rid of anything. She states she started her period 02/12/24. It was heavy with clots for three days and then tapered off. It's been two weeks and she is still bleeding (dark brown). She states she restarted slynd 02/13/24. She also advised she has had covid and flu. Advised sounds normal with extra stressors (sickness). BTB normal on beginning/restarting birth control for about 3-6 months. Monitor and call back with any new concerns. Sending to [facility] for review.

## 2024-03-02 ENCOUNTER — Encounter: Payer: Self-pay | Admitting: Certified Nurse Midwife

## 2024-03-03 ENCOUNTER — Other Ambulatory Visit: Payer: Self-pay | Admitting: Certified Nurse Midwife

## 2024-03-03 DIAGNOSIS — N939 Abnormal uterine and vaginal bleeding, unspecified: Secondary | ICD-10-CM

## 2024-03-03 NOTE — Progress Notes (Signed)
 On Slynd with heavy breakthrough bleeding, ultrasound ordered.

## 2024-03-07 ENCOUNTER — Ambulatory Visit
Admission: RE | Admit: 2024-03-07 | Discharge: 2024-03-07 | Disposition: A | Discharge status: Home / Self Care | Source: Ambulatory Visit | Attending: Certified Nurse Midwife | Admitting: Certified Nurse Midwife

## 2024-03-07 DIAGNOSIS — N888 Other specified noninflammatory disorders of cervix uteri: Secondary | ICD-10-CM | POA: Diagnosis not present

## 2024-03-07 DIAGNOSIS — N939 Abnormal uterine and vaginal bleeding, unspecified: Secondary | ICD-10-CM | POA: Diagnosis not present

## 2024-03-12 ENCOUNTER — Encounter: Payer: Self-pay | Admitting: Certified Nurse Midwife

## 2024-03-26 ENCOUNTER — Ambulatory Visit: Payer: Medicaid Other | Admitting: Dermatology

## 2024-04-08 ENCOUNTER — Ambulatory Visit (INDEPENDENT_AMBULATORY_CARE_PROVIDER_SITE_OTHER): Payer: Medicaid Other | Admitting: Internal Medicine

## 2024-04-08 VITALS — BP 118/74 | Ht 65.5 in | Wt 192.4 lb

## 2024-04-08 DIAGNOSIS — Z6831 Body mass index (BMI) 31.0-31.9, adult: Secondary | ICD-10-CM

## 2024-04-08 DIAGNOSIS — E1165 Type 2 diabetes mellitus with hyperglycemia: Secondary | ICD-10-CM

## 2024-04-08 DIAGNOSIS — Z0001 Encounter for general adult medical examination with abnormal findings: Secondary | ICD-10-CM | POA: Diagnosis not present

## 2024-04-08 DIAGNOSIS — E6609 Other obesity due to excess calories: Secondary | ICD-10-CM | POA: Diagnosis not present

## 2024-04-08 DIAGNOSIS — E66811 Obesity, class 1: Secondary | ICD-10-CM

## 2024-04-08 NOTE — Patient Instructions (Signed)

## 2024-04-08 NOTE — Progress Notes (Signed)
 Subjective:    Patient ID: Melinda Roach, female    DOB: 28-Jul-1971, 53 y.o.   MRN: [id number]  HPI  Patient presents to clinic today for her annual exam.  Flu: Never Tetanus: 02/2014 COVID: Moderna x 2 Shingrix: Never Pap smear: 06/2023, abnormal Mammogram: 07/2023 Colon screening: 11/2015 Vision screening: as needed Dentist: biannually  Diet: She does eat meat. She consumes fruits and veggies. She tries to avoid fried foods. She drinks mostly water, soda. Exercise: Softball  Review of Systems     Past Medical History:  Diagnosis Date   Depression    History of chicken pox     Current Outpatient Medications  Medication Sig Dispense Refill   amoxicillin -clavulanate (AUGMENTIN ) 875-125 MG tablet Take 1 tablet by mouth 2 (two) times daily. 20 tablet 0   Drospirenone  (SLYND ) 4 MG TABS Take 1 tablet (4 mg total) by mouth daily. 84 tablet 4   No current facility-administered medications for this visit.    No Known Allergies   Family History  Problem Relation Age of Onset   Diabetes Mother    Hyperlipidemia Mother    Lung cancer Mother    COPD Father    Depression Daughter     Social History   Socioeconomic History   Marital status: Divorced    Spouse name: Not on file   Number of children: Not on file   Years of education: Not on file   Highest education level: Associate degree: academic program  Occupational History   Not on file  Tobacco Use   Smoking status: Never   Smokeless tobacco: Never  Vaping Use   Vaping status: Never Used  Substance and Sexual Activity   Alcohol use: Yes    Comment: occasional   Drug use: No   Sexual activity: Yes    Birth control/protection: None  Other Topics Concern   Not on file  Social History Narrative   Not on file   Social Drivers of Health   Financial Resource Strain: Low Risk  (07/01/2023)   Overall Financial Resource Strain (CARDIA)    Difficulty of Paying Living Expenses: Not hard at all  Food Insecurity:  No Food Insecurity (07/01/2023)   Hunger Vital Sign    Worried About Running Out of Food in the Last Year: Never true    Ran Out of Food in the Last Year: Never true  Transportation Needs: No Transportation Needs (07/01/2023)   PRAPARE - Administrator, Civil Service (Medical): No    Lack of Transportation (Non-Medical): No  Physical Activity: Insufficiently Active (07/01/2023)   Exercise Vital Sign    Days of Exercise per Week: 2 days    Minutes of Exercise per Session: 10 min  Stress: No Stress Concern Present (07/01/2023)   [employer] of Occupational Health - Occupational Stress Questionnaire    Feeling of Stress : Only a little  Social Connections: Moderately Isolated (07/01/2023)   Social Connection and Isolation Panel [NHANES]    Frequency of Communication with Friends and Family: More than three times a week    Frequency of Social Gatherings with Friends and Family: More than three times a week    Attends Religious Services: 1 to 4 times per year    Active Member of [employer] or Organizations: No    Attends Banker Meetings: Not on file    Marital Status: Divorced  Intimate Partner Violence: Unknown (11/19/2017)   Humiliation, Afraid, Rape, and Kick questionnaire    Fear  of Current or Ex-Partner: Patient declined    Emotionally Abused: Patient declined    Physically Abused: Patient declined    Sexually Abused: Patient declined     Constitutional: Denies fever, malaise, fatigue, headache or abrupt weight changes.  HEENT: Denies eye pain, eye redness, ear pain, ringing in the ears, wax buildup, runny nose, nasal congestion, bloody nose, or sore throat. Respiratory: Denies difficulty breathing, shortness of breath, cough or sputum production.   Cardiovascular: Denies chest pain, chest tightness, palpitations or swelling in the hands or feet.  Gastrointestinal: Denies abdominal pain, bloating, constipation, diarrhea or blood in the stool.  GU: Denies  urgency, frequency, pain with urination, burning sensation, blood in urine, odor or discharge. Musculoskeletal: Denies decrease in range of motion, difficulty with gait, muscle pain or joint pain and swelling.  Skin: Denies redness, rashes, lesions or ulcercations.  Neurological: Pt reports insomnia. Denies dizziness, difficulty with memory, difficulty with speech or problems with balance and coordination.  Psych: Patient has a history of depression.  Denies anxiety, SI/HI.  No other specific complaints in a complete review of systems (except as listed in HPI above).  Objective:   Physical Exam BP 118/74 (BP Location: Left Arm, Patient Position: Sitting, Cuff Size: Normal)   Ht 5' 5.5" (1.664 m)   Wt 192 lb 6.4 oz (87.3 kg)   BMI 31.53 kg/m    Wt Readings from Last 3 Encounters:  02/21/24 190 lb (86.2 kg)  01/03/24 190 lb 6.4 oz (86.4 kg)  12/10/23 188 lb 3.2 oz (85.4 kg)    General: Appears her stated age, obese, in NAD. Skin: Warm, dry and intact HEENT: Head: normal shape and size; Eyes: sclera white, no icterus, conjunctiva pink, PERRLA and EOMs intact;  Neck:  Neck supple, trachea midline. No masses, lumps or thyromegaly present.  Cardiovascular: Normal rate and rhythm. S1,S2 noted.  No murmur, rubs or gallops noted. No JVD or BLE edema. No carotid bruits noted. Pulmonary/Chest: Normal effort and positive vesicular breath sounds. No respiratory distress. No wheezes, rales or ronchi noted.  Abdomen:  Normal bowel sounds.  Musculoskeletal: Strength 5/5 BUE/BLE. No difficulty with gait.  Neurological: Alert and oriented. Cranial nerves II-XII grossly intact. Coordination normal.  Psychiatric: Mood and affect normal. Behavior is normal. Judgment and thought content normal.    BMET    Component Value Date/Time   NA 138 01/03/2024 1515   K 3.7 01/03/2024 1515   CL 103 01/03/2024 1515   CO2 33 (H) 01/03/2024 1515   GLUCOSE 175 (H) 01/03/2024 1515   BUN 12 01/03/2024 1515    CREATININE 0.77 01/03/2024 1515   CALCIUM 9.1 01/03/2024 1515   GFRNONAA >89 11/17/2016 1147   GFRAA >89 11/17/2016 1147    Lipid Panel     Component Value Date/Time   CHOL 191 01/03/2024 1515   TRIG 242 (H) 01/03/2024 1515   HDL 35 (L) 01/03/2024 1515   CHOLHDL 5.5 (H) 01/03/2024 1515   VLDL 28.8 01/10/2021 1108   LDLCALC 119 (H) 01/03/2024 1515    CBC    Component Value Date/Time   WBC 7.7 01/03/2024 1515   RBC 4.49 01/03/2024 1515   HGB 14.0 01/03/2024 1515   HCT 41.4 01/03/2024 1515   PLT 240 01/03/2024 1515   MCV 92.2 01/03/2024 1515   MCH 31.2 01/03/2024 1515   MCHC 33.8 01/03/2024 1515   RDW 13.8 01/03/2024 1515   LYMPHSABS 2,646 11/17/2016 1147   MONOABS 490 11/17/2016 1147   EOSABS 98 11/17/2016 1147  BASOSABS 0 11/17/2016 1147    Hgb A1C Lab Results  Component Value Date   HGBA1C 6.7 (A) 01/03/2024           Assessment & Plan:   Preventative Health Maintenance:  Encouraged her to get a flu shot in the fall Tetanus declined Encouraged her to get her COVID booster Discussed Shingrix vaccine, she will check coverage with her insurance company and schedule visit if she would like to have this done Pap smear UTD Mammogram ordered by GYN Colon screening UTD Encouraged her to consume a balanced diet and exercise regimen Advised her to see an eye doctor and dentist annually We will check CBC, c-Met, lipid, A1c today  RTC in 6 months, follow-up chronic conditions  Helayne Lo, NP

## 2024-04-08 NOTE — Assessment & Plan Note (Signed)
 Encourage diet and exercise for weight loss

## 2024-04-09 ENCOUNTER — Encounter: Payer: Self-pay | Admitting: Internal Medicine

## 2024-04-09 LAB — CBC
HCT: 41.1 % (ref 35.0–45.0)
Hemoglobin: 14 g/dL (ref 11.7–15.5)
MCH: 31.8 pg (ref 27.0–33.0)
MCHC: 34.1 g/dL (ref 32.0–36.0)
MCV: 93.4 fL (ref 80.0–100.0)
MPV: 10.3 fL (ref 7.5–12.5)
Platelets: 307 10*3/uL (ref 140–400)
RBC: 4.4 10*6/uL (ref 3.80–5.10)
RDW: 13.1 % (ref 11.0–15.0)
WBC: 9.3 10*3/uL (ref 3.8–10.8)

## 2024-04-09 LAB — COMPREHENSIVE METABOLIC PANEL WITH GFR
AG Ratio: 1.5 (calc) (ref 1.0–2.5)
ALT: 12 U/L (ref 6–29)
AST: 13 U/L (ref 10–35)
Albumin: 4.7 g/dL (ref 3.6–5.1)
Alkaline phosphatase (APISO): 53 U/L (ref 37–153)
BUN: 11 mg/dL (ref 7–25)
CO2: 25 mmol/L (ref 20–32)
Calcium: 9.4 mg/dL (ref 8.6–10.4)
Chloride: 103 mmol/L (ref 98–110)
Creat: 0.8 mg/dL (ref 0.50–1.03)
Globulin: 3.1 g/dL (ref 1.9–3.7)
Glucose, Bld: 104 mg/dL (ref 65–139)
Potassium: 3.9 mmol/L (ref 3.5–5.3)
Sodium: 138 mmol/L (ref 135–146)
Total Bilirubin: 0.6 mg/dL (ref 0.2–1.2)
Total Protein: 7.8 g/dL (ref 6.1–8.1)
eGFR: 89 mL/min/{1.73_m2} (ref >=60)

## 2024-04-09 LAB — LIPID PANEL
Cholesterol: 197 mg/dL (ref <200)
HDL: 39 mg/dL — ABNORMAL LOW (ref >=50)
LDL Cholesterol (Calc): 130 mg/dL — ABNORMAL HIGH
Non-HDL Cholesterol (Calc): 158 mg/dL — ABNORMAL HIGH (ref <130)
Total CHOL/HDL Ratio: 5.1 (calc) — ABNORMAL HIGH (ref <5.0)
Triglycerides: 161 mg/dL — ABNORMAL HIGH (ref <150)

## 2024-04-09 LAB — HEMOGLOBIN A1C
Hgb A1c MFr Bld: 6.1 % — ABNORMAL HIGH (ref <5.7)
Mean Plasma Glucose: 128 mg/dL
eAG (mmol/L): 7.1 mmol/L

## 2024-05-05 ENCOUNTER — Ambulatory Visit: Admitting: Dermatology

## 2024-07-01 ENCOUNTER — Other Ambulatory Visit (HOSPITAL_COMMUNITY)
Admission: RE | Admit: 2024-07-01 | Discharge: 2024-07-01 | Disposition: A | Discharge status: Home / Self Care | Source: Ambulatory Visit | Attending: Certified Nurse Midwife | Admitting: Certified Nurse Midwife

## 2024-07-01 ENCOUNTER — Ambulatory Visit: Admitting: Certified Nurse Midwife

## 2024-07-01 ENCOUNTER — Encounter: Payer: Self-pay | Admitting: Certified Nurse Midwife

## 2024-07-01 VITALS — BP 96/63 | HR 80 | Ht 65.5 in | Wt 186.2 lb

## 2024-07-01 DIAGNOSIS — Z01419 Encounter for gynecological examination (general) (routine) without abnormal findings: Secondary | ICD-10-CM

## 2024-07-01 DIAGNOSIS — Z3041 Encounter for surveillance of contraceptive pills: Secondary | ICD-10-CM | POA: Diagnosis not present

## 2024-07-01 DIAGNOSIS — R87610 Atypical squamous cells of undetermined significance on cytologic smear of cervix (ASC-US): Secondary | ICD-10-CM | POA: Diagnosis not present

## 2024-07-01 DIAGNOSIS — Z1151 Encounter for screening for human papillomavirus (HPV): Secondary | ICD-10-CM | POA: Diagnosis not present

## 2024-07-01 DIAGNOSIS — Z113 Encounter for screening for infections with a predominantly sexual mode of transmission: Secondary | ICD-10-CM | POA: Diagnosis not present

## 2024-07-01 DIAGNOSIS — N912 Amenorrhea, unspecified: Secondary | ICD-10-CM

## 2024-07-01 DIAGNOSIS — Z124 Encounter for screening for malignant neoplasm of cervix: Secondary | ICD-10-CM | POA: Insufficient documentation

## 2024-07-01 DIAGNOSIS — N926 Irregular menstruation, unspecified: Secondary | ICD-10-CM | POA: Diagnosis not present

## 2024-07-01 DIAGNOSIS — Z1331 Encounter for screening for depression: Secondary | ICD-10-CM | POA: Diagnosis not present

## 2024-07-01 DIAGNOSIS — Z114 Encounter for screening for human immunodeficiency virus [HIV]: Secondary | ICD-10-CM | POA: Diagnosis not present

## 2024-07-01 DIAGNOSIS — Z1272 Encounter for screening for malignant neoplasm of vagina: Secondary | ICD-10-CM

## 2024-07-01 DIAGNOSIS — Z1231 Encounter for screening mammogram for malignant neoplasm of breast: Secondary | ICD-10-CM

## 2024-07-01 NOTE — Progress Notes (Unsigned)
 ANNUAL EXAM Patient name: Melinda Roach MRN [id number]  Date of birth: 1971/06/25 Chief Complaint:   No chief complaint on file.  History of Present Illness:   Melinda Roach is a 53 y.o. G72P4 Caucasian female being seen today for a routine annual exam.  Current complaints: ***  No LMP recorded. Patient is perimenopausal.   The pregnancy intention screening data noted above was reviewed. Potential methods of contraception were discussed. The patient elected to proceed with No data recorded.      Component Value Date/Time   DIAGPAP (A) 06/25/2023 1349    - Atypical squamous cells of undetermined significance (ASC-US )   DIAGPAP - Low grade squamous intraepithelial lesion (LSIL) (A) 05/23/2022 1532   DIAGPAP (A) 04/11/2021 1355    - Atypical squamous cells of undetermined significance (ASC-US )   HPVHIGH Negative 06/25/2023 1349   HPVHIGH Positive (A) 05/23/2022 1532   HPVHIGH Positive (A) 04/11/2021 1355   ADEQPAP  06/25/2023 1349    Satisfactory for evaluation; transformation zone component ABSENT.   ADEQPAP  05/23/2022 1532    Satisfactory for evaluation; transformation zone component ABSENT.   ADEQPAP  04/11/2021 1355    Satisfactory for evaluation; transformation zone component PRESENT.       Last pap ***. Results were: {Pap findings:25134}. H/O abnormal pap: {yes/yes***/no:23866} Last mammogram: ***. Results were: {normal, abnormal, n/a:23837}. Family h/o breast cancer: {yes***/no:23838} Last colonoscopy: ***. Results were: {normal, abnormal, n/a:23837}. Family h/o colorectal cancer: {yes***/no:23838}     04/08/2024    3:03 PM 02/21/2024    9:39 AM 01/03/2024    3:14 PM 01/03/2024    3:10 PM 12/10/2023   10:01 AM  Depression screen PHQ 2/9  Decreased Interest 0 0 0 0 0  Down, Depressed, Hopeless 0 0 0 0 0  PHQ - 2 Score 0 0 0 0 0  Altered sleeping 0 0 0    Tired, decreased energy 0 0 0    Change in appetite 0 0 0    Feeling bad or failure about yourself  0 0 0     Trouble concentrating 0 0 0    Moving slowly or fidgety/restless 0 0 0    Suicidal thoughts 0 0 0    PHQ-9 Score 0 0 0    Difficult doing work/chores Not difficult at all Not difficult at all Not difficult at all          04/08/2024    3:04 PM 02/21/2024    9:39 AM 01/03/2024    3:10 PM 12/10/2023   10:01 AM  GAD 7 : Generalized Anxiety Score  Nervous, Anxious, on Edge 0 0 0 0  Control/stop worrying 0 0 0 0  Worry too much - different things 0 0 0 0  Trouble relaxing 0 0 0 0  Restless 0 0 0 0  Easily annoyed or irritable 0 0 0 0  Afraid - awful might happen 0 0 0 0  Total GAD 7 Score 0 0 0 0  Anxiety Difficulty Not difficult at all Not difficult at all Not difficult at all Not difficult at all     Review of Systems:   Pertinent items are noted in HPI Denies any headaches, blurred vision, fatigue, shortness of breath, chest pain, abdominal pain, abnormal vaginal discharge/itching/odor/irritation, problems with periods, bowel movements, urination, or intercourse unless otherwise stated above. Pertinent History Reviewed:  Reviewed past medical,surgical, social and family history.  Reviewed problem list, medications and allergies. Physical Assessment:  There were no vitals filed  for this visit.There is no height or weight on file to calculate BMI.       Physical Exam   No results found for this or any previous visit (from the past 24 hours).  Assessment & Plan:  1. Well woman exam (Primary)  2. Breast cancer screening by mammogram  3. Cervical cancer screening  4. Encounter for screening for human papillomavirus (HPV)  5. ASCUS of cervix with negative high risk HPV   Mammogram: {Mammo f/u:25212::@ 52yo}, or sooner if problems Colonoscopy: {TCS f/u:25213::@ 53yo}, or sooner if problems  No orders of the defined types were placed in this encounter.   Meds: No orders of the defined types were placed in this encounter.   Follow-up: No follow-ups on  file.  Harlene LITTIE Cisco, CNM 07/01/2024 9:13 AM

## 2024-07-02 LAB — CERVICOVAGINAL ANCILLARY ONLY
Chlamydia: NEGATIVE
Comment: NEGATIVE
Comment: NEGATIVE
Comment: NORMAL
Neisseria Gonorrhea: NEGATIVE
Trichomonas: NEGATIVE

## 2024-07-02 MED ORDER — SLYND 4 MG PO TABS: 1.0000 | ORAL_TABLET | Freq: Every day | ORAL | 4 refills | Status: DC

## 2024-07-04 ENCOUNTER — Encounter: Payer: Self-pay | Admitting: Certified Nurse Midwife

## 2024-07-04 DIAGNOSIS — A539 Syphilis, unspecified: Secondary | ICD-10-CM

## 2024-07-07 ENCOUNTER — Ambulatory Visit: Payer: Self-pay | Admitting: Certified Nurse Midwife

## 2024-07-08 LAB — CYTOLOGY - PAP
Adequacy: ABSENT
Comment: NEGATIVE
Diagnosis: NEGATIVE
High risk HPV: NEGATIVE

## 2024-07-09 ENCOUNTER — Telehealth: Payer: Self-pay

## 2024-07-09 LAB — HSV 1 AND 2 AB, IGG
HSV 1 Glycoprotein G Ab, IgG: REACTIVE — AB
HSV 2 IgG, Type Spec: REACTIVE — AB

## 2024-07-09 LAB — RPR: RPR Ser Ql: REACTIVE — AB

## 2024-07-09 LAB — ESTRADIOL: Estradiol: 31.4 pg/mL

## 2024-07-09 LAB — HIV ANTIBODY (ROUTINE TESTING W REFLEX): HIV Screen 4th Generation wRfx: NONREACTIVE

## 2024-07-09 LAB — HEPATITIS C ANTIBODY: Hep C Virus Ab: NONREACTIVE

## 2024-07-09 LAB — RPR, QUANT+TP ABS (REFLEX)
Rapid Plasma Reagin, Quant: 1:4 {titer} — ABNORMAL HIGH
T Pallidum Abs: REACTIVE — AB

## 2024-07-09 LAB — FSH/LH
FSH: 4 m[IU]/mL
LH: 1.1 m[IU]/mL

## 2024-07-09 NOTE — Telephone Encounter (Signed)
 Joane Shine with [facility] calling regarding patients +RPR. He is inquiring about results of quant and T Pallidum. Advised  Component Ref Range & Units (hover) 8 d ago  Rapid Plasma Reagin, Quant 1:4 High   T Pallidum Abs Reactive Abnormal

## 2024-07-10 ENCOUNTER — Telehealth: Payer: Self-pay

## 2024-07-10 NOTE — Telephone Encounter (Signed)
 Phone call placed to pt at [phone]. Pt confirmed identity. States she wanted to follow-up about her syphilis and herpes TR she had performed with her doctor's office. Was told we would call her or she could call us .  States she has never tested positive for anything in the past. Desires appt with [facility] ASAP.  [facility] provider appt scheduled for 07/11/24 PM.  (07/01/24 labs and office notes from [facility] in Epic.)

## 2024-07-10 NOTE — Telephone Encounter (Signed)
 [facility] received call from pt following-up about syphilis and herpes results, labs completed at her doctor's office.

## 2024-07-11 ENCOUNTER — Ambulatory Visit: Payer: Self-pay | Admitting: Family Medicine

## 2024-07-11 ENCOUNTER — Encounter: Payer: Self-pay | Admitting: Family Medicine

## 2024-07-11 DIAGNOSIS — A539 Syphilis, unspecified: Secondary | ICD-10-CM

## 2024-07-11 DIAGNOSIS — Z113 Encounter for screening for infections with a predominantly sexual mode of transmission: Secondary | ICD-10-CM

## 2024-07-11 MED ORDER — VALACYCLOVIR HCL 500 MG PO TABS: 500.0000 mg | ORAL_TABLET | Freq: Two times a day (BID) | ORAL | 3 refills | Status: AC

## 2024-07-11 MED ORDER — DOXYCYCLINE HYCLATE 100 MG PO TABS: 100.0000 mg | ORAL_TABLET | Freq: Two times a day (BID) | ORAL | Status: AC

## 2024-07-11 NOTE — Progress Notes (Signed)
 The patient was dispensed doxycycline #56 today. I provided counseling today regarding the medication. We discussed the medication, the side effects and when to call clinic. Patient given the opportunity to ask questions. Questions answered.  Larraine JONELLE Northern, RN

## 2024-07-11 NOTE — Progress Notes (Signed)
 [facility] Problem Visit  [facility]  Subjective:  Melinda Roach is a 53 y.o. being seen today for syphilis.  Chief Complaint  Patient presents with   SEXUALLY TRANSMITTED DISEASE   HPI New syphilis diagnosis on 07/01/24 and referred from [facility] for treatment. Her titer was 1:4 and she has not had any chancre, rashes, or neurological symptoms. Last negative RPR was on 04/02/23. Last time she had sex was 2 days ago. Has only have that female partner in last 2 months.  Will treat as latent infection given uncertain duration of infection.  Health Maintenance Due  Topic Date Due   Hepatitis B Vaccines (1 of 3 - 19+ 3-dose series) Never done   Zoster Vaccines- Shingrix (1 of 2) Never done   DTaP/Tdap/Td (2 - Td or Tdap) 02/17/2024   Diabetic kidney evaluation - Urine ACR  07/01/2024   The following portions of the patient's history were reviewed and updated as appropriate: allergies, current medications, past family history, past medical history, past social history, past surgical history and problem list. Problem list updated.  Objective:  There were no vitals filed for this visit.  Physical Exam Constitutional:      Appearance: Normal appearance.  HENT:     Head: Normocephalic.     Mouth/Throat:     Mouth: Mucous membranes are moist.  Eyes:     General: No scleral icterus.       Right eye: No discharge.        Left eye: No discharge.  Pulmonary:     Effort: Pulmonary effort is normal.  Skin:    General: Skin is warm and dry.  Neurological:     General: No focal deficit present.     Mental Status: She is alert.  Psychiatric:        Mood and Affect: Mood normal.        Behavior: Behavior normal.    Assessment and Plan:  Melinda Roach is a 53 y.o. female presenting to the [facility] Department for a Women's Health problem visit  1. Syphilis (Primary)  - doxycycline (VIBRA-TABS) 100 MG tablet; Take 1 tablet (100 mg  total) by mouth 2 (two) times daily for 28 days.  Return in about 6 months (around 01/11/2025) for syphilis re-testing.  Future Appointments  Date Time Provider Department Center  07/11/2024  3:20 PM Macario Dorothyann HERO, MD [facility] None  10/06/2024  3:00 PM Antonette Angeline ORN, NP [facility]    Damien FORBES Satchel [facility]

## 2024-07-14 NOTE — Telephone Encounter (Signed)
 Pt kept 07/11/24 appt. Pt treated with doxycycline  100 mg PO BID x 28 days per Dr Francesco order.

## 2024-07-17 ENCOUNTER — Encounter: Payer: Self-pay | Admitting: Certified Nurse Midwife

## 2024-07-28 ENCOUNTER — Ambulatory Visit: Admitting: Obstetrics & Gynecology

## 2024-08-29 ENCOUNTER — Telehealth: Payer: Self-pay

## 2024-08-29 NOTE — Telephone Encounter (Signed)
 Patient advised and verbalized understanding

## 2024-08-29 NOTE — Telephone Encounter (Signed)
 Ok to be seen next week if no chest pain. Palpitations can be caused by stress and anxiety.

## 2024-08-29 NOTE — Telephone Encounter (Signed)
 Copied from CRM #[id number]. Topic: Clinical - Medical Advice >> Aug 29, 2024 12:56 PM Sophia H wrote: Reason for CRM:   Patient states feels like her heart is adding a beat / states not chest pain and not heart fluttering. She cannot fully explain it, wanted to be seen this afternoon but nothing available in clinic. Would like a call back from Regina's nurse/MA. Ty # [phone]

## 2024-09-23 ENCOUNTER — Encounter: Payer: Self-pay | Admitting: Certified Nurse Midwife

## 2024-09-30 ENCOUNTER — Other Ambulatory Visit: Payer: Self-pay | Admitting: Internal Medicine

## 2024-09-30 MED ORDER — NORETHINDRONE 0.35 MG PO TABS: 1.0000 | ORAL_TABLET | Freq: Every day | ORAL | 4 refills | Status: AC

## 2024-10-02 NOTE — Telephone Encounter (Signed)
 Requested by interface [email or web address]. Medication discontinued 04/08/24.  Requested Prescriptions  Refused Prescriptions Disp Refills   amoxicillin -clavulanate (AUGMENTIN ) 875-125 MG tablet [Pharmacy Med Name: AMOXICILLIN -CLAV 875-125MG  TAB] 20 tablet 0    Sig: TAKE 1 TABLET BY MOUTH TWICE A DAY     Off-Protocol Failed - 10/02/2024  1:53 PM      Failed - Medication not assigned to a protocol, review manually.      Passed - Valid encounter within last 12 months    Recent Outpatient Visits           5 months ago Encounter for general adult medical examination with abnormal findings   [place] [facility] [place], Angeline ORN, NP   7 months ago Tonsillitis   [place] [facility] [place], Angeline ORN, [place]

## 2024-10-06 ENCOUNTER — Ambulatory Visit: Admitting: Internal Medicine
